# Patient Record
Sex: Female | Born: 1940 | Race: Black or African American | Hispanic: No | Smoking: Never smoker
Health system: Southern US, Community
[De-identification: ages and names within clinical notes are randomized; demographics above are authoritative.]

## PROBLEM LIST (undated history)

## (undated) DIAGNOSIS — E041 Nontoxic single thyroid nodule: Secondary | ICD-10-CM

## (undated) DIAGNOSIS — M109 Gout, unspecified: Secondary | ICD-10-CM

## (undated) DIAGNOSIS — I10 Essential (primary) hypertension: Secondary | ICD-10-CM

## (undated) DIAGNOSIS — E119 Type 2 diabetes mellitus without complications: Secondary | ICD-10-CM

## (undated) DIAGNOSIS — M810 Age-related osteoporosis without current pathological fracture: Secondary | ICD-10-CM

## (undated) DIAGNOSIS — D472 Monoclonal gammopathy: Secondary | ICD-10-CM

## (undated) DIAGNOSIS — I4891 Unspecified atrial fibrillation: Secondary | ICD-10-CM

## (undated) HISTORY — DX: Monoclonal gammopathy: D47.2

## (undated) HISTORY — DX: Unspecified atrial fibrillation: I48.91

---

## 1964-02-20 HISTORY — PX: TUBAL LIGATION: SHX77

## 2004-01-19 ENCOUNTER — Emergency Department: Payer: Self-pay | Admitting: Emergency Medicine

## 2004-02-23 ENCOUNTER — Ambulatory Visit: Payer: Self-pay

## 2005-01-13 ENCOUNTER — Emergency Department: Payer: Self-pay | Admitting: Emergency Medicine

## 2005-08-08 ENCOUNTER — Ambulatory Visit: Payer: Self-pay

## 2006-12-17 ENCOUNTER — Emergency Department: Payer: Self-pay | Admitting: Internal Medicine

## 2007-02-02 ENCOUNTER — Emergency Department: Payer: Self-pay | Admitting: Emergency Medicine

## 2007-02-25 ENCOUNTER — Ambulatory Visit: Payer: Self-pay | Admitting: Unknown Physician Specialty

## 2007-08-24 ENCOUNTER — Other Ambulatory Visit: Payer: Self-pay

## 2007-08-24 ENCOUNTER — Emergency Department: Payer: Self-pay | Admitting: Emergency Medicine

## 2008-05-14 ENCOUNTER — Emergency Department: Payer: Self-pay | Admitting: Emergency Medicine

## 2008-09-07 ENCOUNTER — Ambulatory Visit: Payer: Self-pay | Admitting: Internal Medicine

## 2009-09-22 ENCOUNTER — Ambulatory Visit: Payer: Self-pay | Admitting: Internal Medicine

## 2009-10-04 ENCOUNTER — Ambulatory Visit: Payer: Self-pay | Admitting: Internal Medicine

## 2009-10-18 ENCOUNTER — Ambulatory Visit: Payer: Self-pay | Admitting: Internal Medicine

## 2011-10-25 ENCOUNTER — Ambulatory Visit: Payer: Self-pay | Admitting: Internal Medicine

## 2011-11-20 ENCOUNTER — Ambulatory Visit: Payer: Self-pay | Admitting: Internal Medicine

## 2011-11-20 LAB — FERRITIN: Ferritin (ARMC): 72 ng/mL (ref 8–388)

## 2011-11-20 LAB — LACTATE DEHYDROGENASE: LDH: 235 U/L (ref 81–246)

## 2011-11-20 LAB — CBC CANCER CENTER
Basophil #: 0.1 x10 3/mm (ref 0.0–0.1)
Eosinophil #: 0.1 x10 3/mm (ref 0.0–0.7)
Eosinophil: 2 %
HCT: 40.1 % (ref 35.0–47.0)
Lymphocytes: 19 %
MCH: 33.4 pg (ref 26.0–34.0)
MCHC: 33.4 g/dL (ref 32.0–36.0)
Monocyte #: 0.5 x10 3/mm (ref 0.2–0.9)
Monocytes: 6 %
Neutrophil %: 63.5 %
Platelet: 220 x10 3/mm (ref 150–440)
RBC: 4.01 10*6/uL (ref 3.80–5.20)
RDW: 14.5 % (ref 11.5–14.5)
Segmented Neutrophils: 69 %

## 2011-11-20 LAB — IRON AND TIBC
Iron Bind.Cap.(Total): 320 ug/dL (ref 250–450)
Iron: 123 ug/dL (ref 50–170)
Unbound Iron-Bind.Cap.: 197 ug/dL

## 2011-11-20 LAB — RETICULOCYTES: Absolute Retic Count: 0.0685 10*6/uL (ref 0.023–0.096)

## 2011-11-20 LAB — FOLATE: Folic Acid: 16.2 ng/mL (ref 3.1–100.0)

## 2011-11-20 LAB — PROTIME-INR: INR: 1.1

## 2011-12-21 ENCOUNTER — Ambulatory Visit: Payer: Self-pay | Admitting: Internal Medicine

## 2012-08-25 ENCOUNTER — Ambulatory Visit: Payer: Self-pay | Admitting: Internal Medicine

## 2012-08-26 LAB — RETICULOCYTES
Absolute Retic Count: 0.0633 10*6/uL
Reticulocyte: 1.57 %

## 2012-08-26 LAB — CBC CANCER CENTER
Basophil: 1 %
Comment - H1-Com1: NORMAL
Comment - H1-Com2: NORMAL
HCT: 40.1 % (ref 35.0–47.0)
HGB: 13.7 g/dL (ref 12.0–16.0)
Lymphocytes: 25 %
MCH: 33.9 pg (ref 26.0–34.0)
MCHC: 34.1 g/dL (ref 32.0–36.0)
MCV: 99 fL (ref 80–100)
Monocytes: 7 %
Platelet: 230 x10 3/mm (ref 150–440)
RBC: 4.04 10*6/uL (ref 3.80–5.20)
RDW: 13.4 % (ref 11.5–14.5)
Segmented Neutrophils: 67 %
WBC: 7.9 x10 3/mm (ref 3.6–11.0)

## 2012-08-26 LAB — IRON AND TIBC
Iron Bind.Cap.(Total): 307 ug/dL (ref 250–450)
Iron Saturation: 17 %
Iron: 53 ug/dL (ref 50–170)
Unbound Iron-Bind.Cap.: 254 ug/dL

## 2012-08-26 LAB — PROTIME-INR
INR: 1.1
Prothrombin Time: 14.2 secs (ref 11.5–14.7)

## 2012-08-27 LAB — PROT IMMUNOELECTROPHORES(ARMC)

## 2012-08-28 LAB — URINE IEP, RANDOM

## 2012-09-19 ENCOUNTER — Ambulatory Visit: Payer: Self-pay | Admitting: Internal Medicine

## 2012-09-30 LAB — CBC CANCER CENTER
Basophil #: 0 x10 3/mm (ref 0.0–0.1)
Eosinophil %: 2.2 %
HCT: 39.9 % (ref 35.0–47.0)
Lymphocyte #: 2.3 x10 3/mm (ref 1.0–3.6)
Lymphocyte %: 37.8 %
MCH: 33.7 pg (ref 26.0–34.0)
MCHC: 33.7 g/dL (ref 32.0–36.0)
MCV: 100 fL (ref 80–100)
Platelet: 199 x10 3/mm (ref 150–440)
RBC: 3.99 10*6/uL (ref 3.80–5.20)
RDW: 13.3 % (ref 11.5–14.5)
WBC: 6.2 x10 3/mm (ref 3.6–11.0)

## 2012-09-30 LAB — RETICULOCYTES: Reticulocyte: 1.22 %

## 2012-10-20 ENCOUNTER — Ambulatory Visit: Payer: Self-pay | Admitting: Internal Medicine

## 2012-12-24 ENCOUNTER — Ambulatory Visit: Payer: Self-pay | Admitting: Internal Medicine

## 2013-07-15 IMAGING — MG MM CAD SCREENING MAMMO
1 series · 4 of 4 positions shown · non-contrast
Comparison: none

REASON FOR EXAM: SCR MAMMO NO ORDER
COMMENTS:

[R CC · right · 4 of 4 slices shown]
[im 1/4]
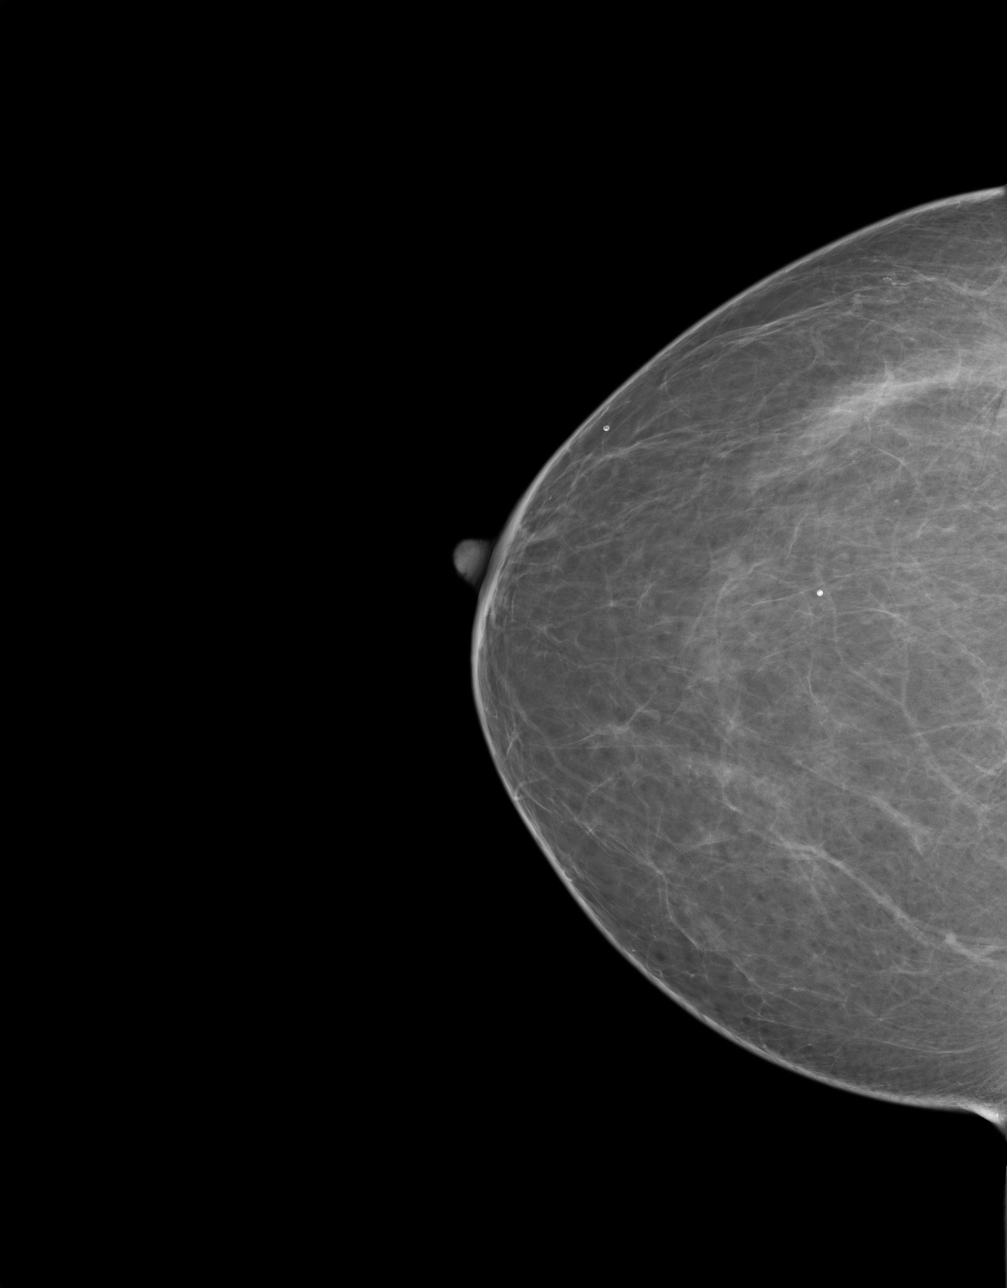
[im 2/4]
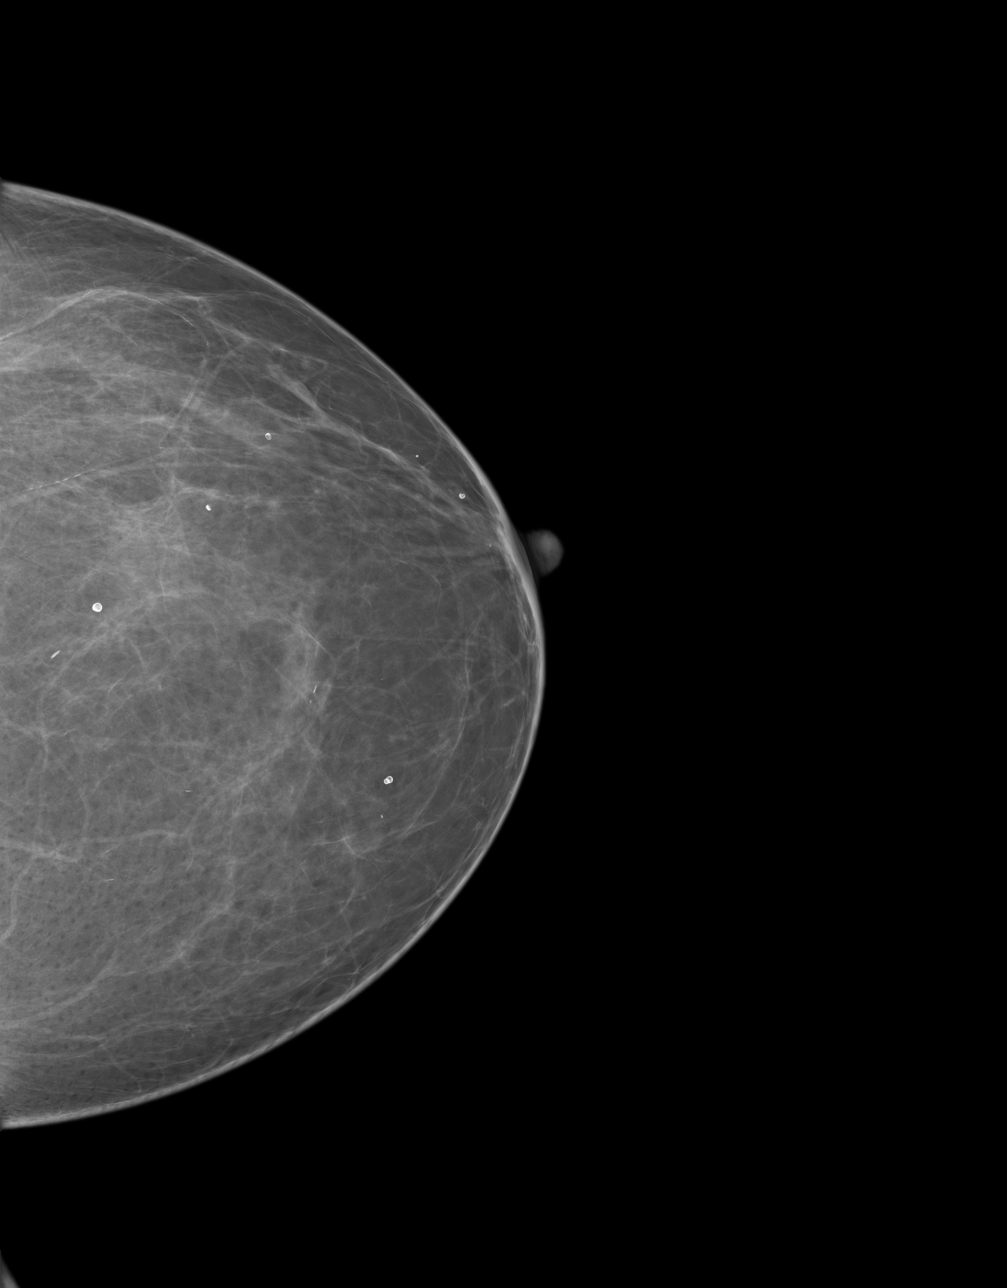
[im 3/4]
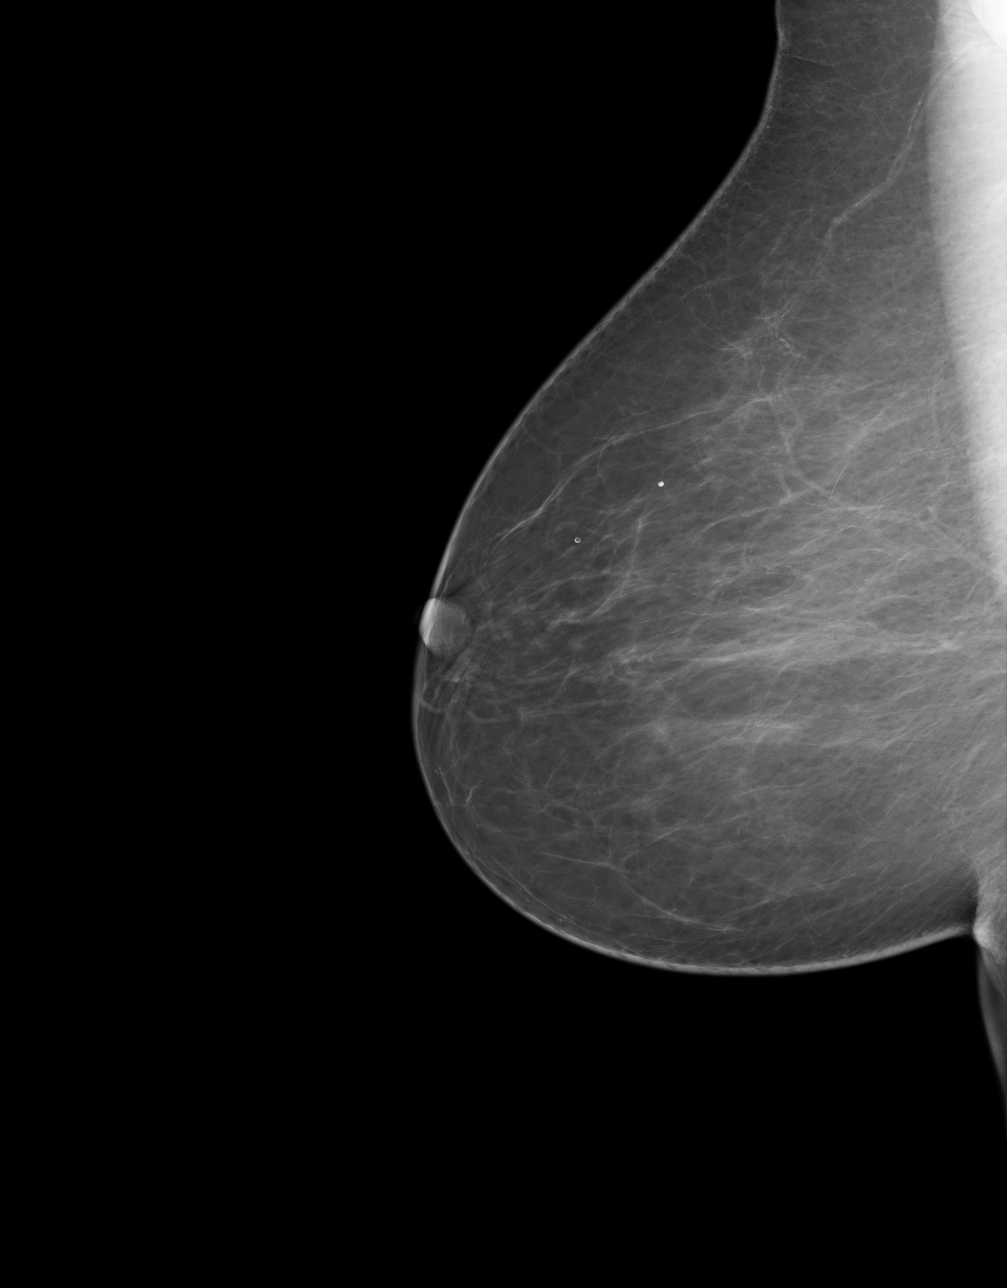
[im 4/4]
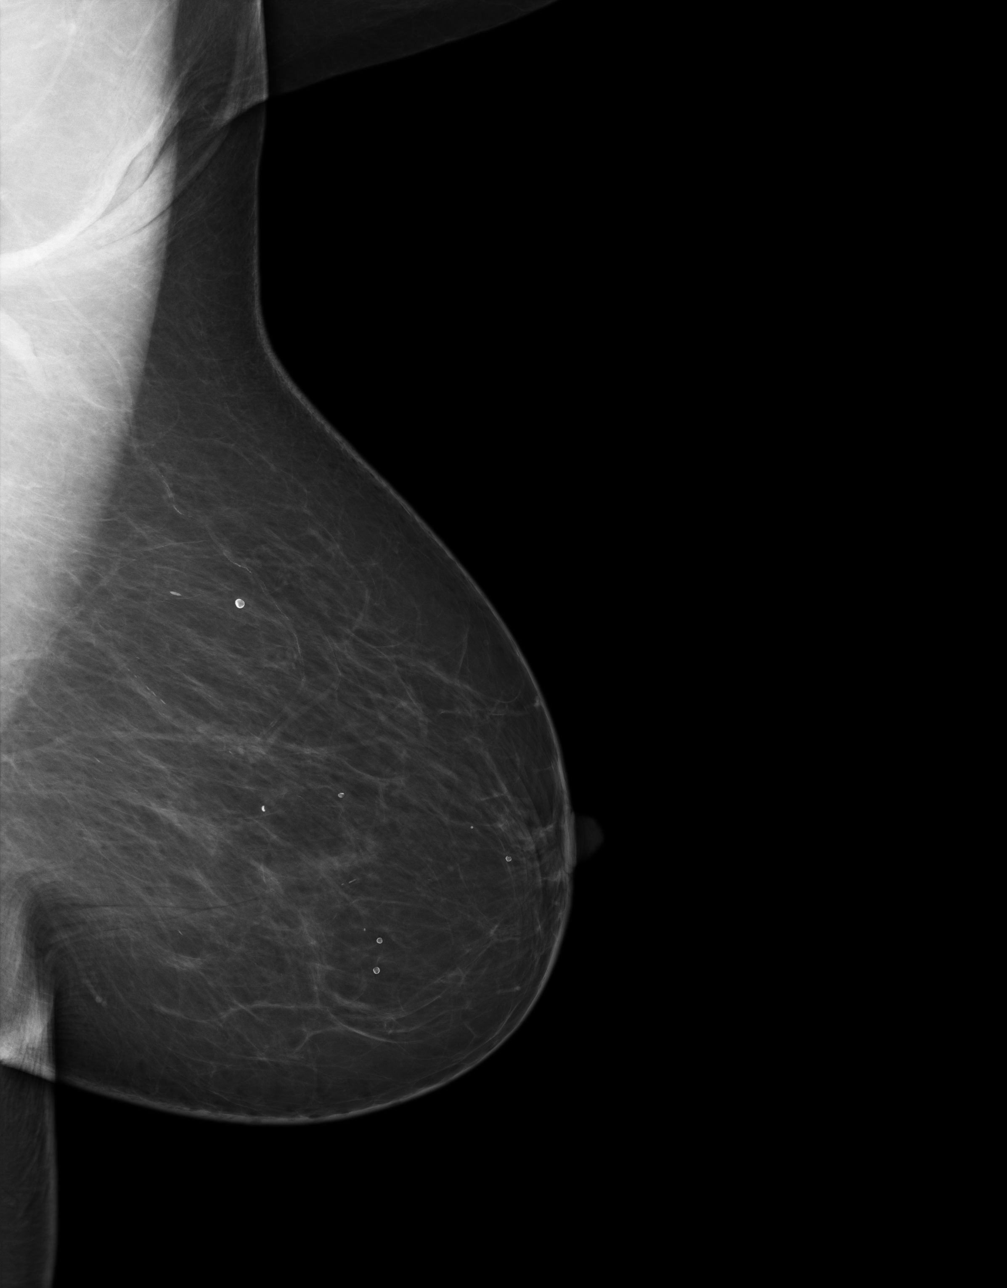

[4 of 4 positions shown; findings below may reference images not displayed]

PROCEDURE:     MAM - MAM DGTL SCRN MAM NO ORDER W/CAD  - October 25, 2011 [DATE]

RESULT:     Comparison is made to prior studies dated 10-18-2009 and
09-07-2008.

The breasts demonstrate a mild heterogeneous parenchymal pattern. Scattered,
benign appearing calcifications are identified within the right and left
breast. There is no radiographic evidence to suggest malignancy.
IMPRESSION: BI-RADS: Category 2 - Benign Findings

A NEGATIVE MAMMOGRAM REPORT DOES NOT PRECLUDE BIOPSY OR OTHER EVALUATION OF
A CLINICALLY PALPABLE OR OTHERWISE SUSPICIOUS MASS OR LESION. BREAST CANCER
MAY NOT BE DETECTED BY MAMMOGRAPHY IN UP TO 10% OF CASES.

## 2013-10-23 ENCOUNTER — Ambulatory Visit: Payer: Self-pay | Admitting: Internal Medicine

## 2013-10-23 LAB — CBC CANCER CENTER
Basophil #: 0 x10 3/mm (ref 0.0–0.1)
Basophil %: 0.7 %
EOS ABS: 0.1 x10 3/mm (ref 0.0–0.7)
EOS PCT: 1.8 %
HCT: 41.9 % (ref 35.0–47.0)
HGB: 13.8 g/dL (ref 12.0–16.0)
LYMPHS ABS: 2.2 x10 3/mm (ref 1.0–3.6)
Lymphocyte %: 42.4 %
MCH: 33.3 pg (ref 26.0–34.0)
MCHC: 32.9 g/dL (ref 32.0–36.0)
MCV: 101 fL — ABNORMAL HIGH (ref 80–100)
MONO ABS: 0.4 x10 3/mm (ref 0.2–0.9)
MONOS PCT: 7.5 %
Neutrophil #: 2.4 x10 3/mm (ref 1.4–6.5)
Neutrophil %: 47.6 %
PLATELETS: 213 x10 3/mm (ref 150–440)
RBC: 4.14 10*6/uL (ref 3.80–5.20)
RDW: 13.8 % (ref 11.5–14.5)
WBC: 5.1 x10 3/mm (ref 3.6–11.0)

## 2013-10-23 LAB — CALCIUM: CALCIUM: 9.6 mg/dL (ref 8.5–10.1)

## 2013-10-23 LAB — CREATININE, SERUM
Creatinine: 1.02 mg/dL (ref 0.60–1.30)
EGFR (African American): >60
EGFR (Non-African Amer.): 55 — ABNORMAL LOW

## 2013-10-27 LAB — PROT IMMUNOELECTROPHORES(ARMC)

## 2013-11-19 ENCOUNTER — Ambulatory Visit: Payer: Self-pay | Admitting: Internal Medicine

## 2014-02-25 ENCOUNTER — Ambulatory Visit: Payer: Self-pay | Admitting: Internal Medicine

## 2014-10-22 ENCOUNTER — Other Ambulatory Visit: Payer: Self-pay | Admitting: Family Medicine

## 2014-10-22 DIAGNOSIS — R221 Localized swelling, mass and lump, neck: Secondary | ICD-10-CM

## 2014-10-29 ENCOUNTER — Inpatient Hospital Stay: Payer: Medicare Other | Attending: Internal Medicine

## 2014-10-29 ENCOUNTER — Ambulatory Visit
Admission: RE | Admit: 2014-10-29 | Discharge: 2014-10-29 | Disposition: A | Discharge status: Home / Self Care | Payer: Medicare Other | Source: Ambulatory Visit | Attending: Family Medicine | Admitting: Family Medicine

## 2014-10-29 ENCOUNTER — Inpatient Hospital Stay (HOSPITAL_BASED_OUTPATIENT_CLINIC_OR_DEPARTMENT_OTHER): Payer: Medicare Other | Admitting: Internal Medicine

## 2014-10-29 VITALS — BP 143/85 | HR 102 | Temp 96.9°F | Resp 18 | Ht 61.5 in | Wt 167.3 lb

## 2014-10-29 DIAGNOSIS — Z79899 Other long term (current) drug therapy: Secondary | ICD-10-CM

## 2014-10-29 DIAGNOSIS — R221 Localized swelling, mass and lump, neck: Secondary | ICD-10-CM

## 2014-10-29 DIAGNOSIS — E669 Obesity, unspecified: Secondary | ICD-10-CM | POA: Insufficient documentation

## 2014-10-29 DIAGNOSIS — I10 Essential (primary) hypertension: Secondary | ICD-10-CM | POA: Insufficient documentation

## 2014-10-29 DIAGNOSIS — D696 Thrombocytopenia, unspecified: Secondary | ICD-10-CM

## 2014-10-29 DIAGNOSIS — K219 Gastro-esophageal reflux disease without esophagitis: Secondary | ICD-10-CM | POA: Insufficient documentation

## 2014-10-29 DIAGNOSIS — M109 Gout, unspecified: Secondary | ICD-10-CM | POA: Diagnosis not present

## 2014-10-29 DIAGNOSIS — E119 Type 2 diabetes mellitus without complications: Secondary | ICD-10-CM | POA: Diagnosis not present

## 2014-10-29 DIAGNOSIS — F329 Major depressive disorder, single episode, unspecified: Secondary | ICD-10-CM | POA: Diagnosis not present

## 2014-10-29 DIAGNOSIS — E785 Hyperlipidemia, unspecified: Secondary | ICD-10-CM | POA: Insufficient documentation

## 2014-10-29 DIAGNOSIS — E041 Nontoxic single thyroid nodule: Secondary | ICD-10-CM | POA: Insufficient documentation

## 2014-10-29 DIAGNOSIS — Z7982 Long term (current) use of aspirin: Secondary | ICD-10-CM | POA: Insufficient documentation

## 2014-10-29 DIAGNOSIS — D472 Monoclonal gammopathy: Secondary | ICD-10-CM

## 2014-10-29 LAB — CBC WITH DIFFERENTIAL/PLATELET
BASOS PCT: 1 %
Basophils Absolute: 0 10*3/uL (ref 0–0.1)
Eosinophils Absolute: 0.1 10*3/uL (ref 0–0.7)
Eosinophils Relative: 2 %
HCT: 40.2 % (ref 35.0–47.0)
HEMOGLOBIN: 13.3 g/dL (ref 12.0–16.0)
LYMPHS PCT: 39 %
Lymphs Abs: 1.9 10*3/uL (ref 1.0–3.6)
MCH: 32.8 pg (ref 26.0–34.0)
MCHC: 33.1 g/dL (ref 32.0–36.0)
MCV: 99.2 fL (ref 80.0–100.0)
MONO ABS: 0.4 10*3/uL (ref 0.2–0.9)
Monocytes Relative: 8 %
NEUTROS ABS: 2.6 10*3/uL (ref 1.4–6.5)
NEUTROS PCT: 50 %
Platelets: 255 10*3/uL (ref 150–440)
RBC: 4.05 MIL/uL (ref 3.80–5.20)
RDW: 13.5 % (ref 11.5–14.5)
WBC: 5.1 10*3/uL (ref 3.6–11.0)

## 2014-10-29 LAB — CALCIUM: Calcium: 9.5 mg/dL (ref 8.9–10.3)

## 2014-10-29 LAB — CREATININE, SERUM
Creatinine, Ser: 0.91 mg/dL (ref 0.44–1.00)
GFR calc Af Amer: >60 mL/min (ref >60)
GFR calc non Af Amer: >60 mL/min (ref >60)

## 2014-10-29 NOTE — Progress Notes (Signed)
Patient is here for follow-up. Patient states that she had a mass pop up in her neck a week ago from Sunday. Patient states that it was painful and  she was having trouble swallowing. She is scheduled for ultrasound of thyroid this afternoon.

## 2014-11-01 LAB — PROTEIN ELECTROPHORESIS, SERUM
A/G Ratio: 1 (ref 0.7–1.7)
ALPHA-2-GLOBULIN: 0.8 g/dL (ref 0.4–1.0)
Albumin ELP: 3.8 g/dL (ref 2.9–4.4)
Alpha-1-Globulin: 0.3 g/dL (ref 0.0–0.4)
BETA GLOBULIN: 1.7 g/dL — AB (ref 0.7–1.3)
GLOBULIN, TOTAL: 3.9 g/dL (ref 2.2–3.9)
Gamma Globulin: 1.1 g/dL (ref 0.4–1.8)
M-SPIKE, %: 0.4 g/dL — AB
Total Protein ELP: 7.7 g/dL (ref 6.0–8.5)

## 2014-11-14 NOTE — Progress Notes (Signed)
Tracey Love  Telephone:(336) [phone] Fax:(336) [phone]     ID: Tracey Love OB: Oct 16, 1940  MR#: [id number]  JYN#:[id number]  Patient Care Team: [place] as PCP - General (General Practice)  CHIEF COMPLAINT/DIAGNOSIS:  Thrombocytopenia  -  spontaneously improved on CBC of 11/20/11 and again on CBC of 08/26/12.  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 Workup otherwise remarkable for Monoclonal gammopathy of unknown significance (SIEP showed 0.4 g M protein with IgG of 1905)  Workup done 08/26/12 - Platelets 199K, hemoglobin 13.4, WBC 6200 with normal differential, reticulocyte 0.048. Random-UIEP shows negative M-spike but immunofixation positive for IgG monoclonal protein with lambda light chain specificity. SIEP shows M-spike of 0.5 g, serum IgG 1831. Otherwise PTT, INR, B12, folate were unremarkable. Serum iron 53, iron saturation slightly low at 17%.    Workup done 11/20/11 -  hemoglobin 13.4, WBC 7800, unremarkable differential, platelets 220, reticulocyte 0.0685. SIEP showed 0.4 g M-protein with IgG of 1905.   Positive direct platelet antibody test (Ia/IIa and IIb/IIIa positive) Iron study, B12, folate, LDH, haptoglobin, random-UIEP, ANA, peripheral blood flow cytometry, PT, PTT, FDP, fibrinogen, HBsAg, HCV Ab, HIV Ab all unremarkable Ultrasound abdomen negative for hepatosplenomegaly.    HISTORY OF PRESENT ILLNESS:  Patient returns for continued hematology followup, she was seen one year ago. Overall states that she is doing fairly steady, she has chronic fatigue on exertion which is the same. Denies any bleeding symptoms. No new bone pains. No new paresthesias in extremities. No fevers. Eating well. Platelet count most recently was normal range.   REVIEW OF SYSTEMS:   ROS As in HPI above. In addition, no fever, chills. No new headaches or focal weakness.  No new sore throat, cough,  sputum, hemoptysis or chest pain. No dizziness or palpitation. No abdominal pain, constipation, diarrhea, dysuria or hematuria. No new skin rash or bleeding symptoms. No new paresthesias in extremities. No polyuria polydipsia.  PAST MEDICAL HISTORY: Reviewed.         Hypertension  Hyperlipidemia  Diabetes mellitus type   ? history of arrhythmia  Chronic thrombocytopenia since 2009  Chronic fatigue  Urinary incontinence  Hypercalcemia  Gout  GERD  Depression  Osteoarthritis  Obesity  PAST SURGICAL HISTORY: Reviewed. As above.  FAMILY HISTORY: Reviewed. Denies malignancy or hematological disorders, except for daughter had blood clot.  SOCIAL HISTORY: Reviewed. Denies smoking or alcohol usage.   Allergies not on file  Current Outpatient Prescriptions  Medication Sig Dispense Refill  . acetaminophen (TYLENOL) 500 MG tablet Take 500 mg by mouth every 6 (six) hours as needed for mild pain or moderate pain.    Marland Kitchen amLODipine (NORVASC) 5 MG tablet Take 5 mg by mouth daily.    Marland Kitchen aspirin EC 81 MG tablet Take 81 mg by mouth daily.    . Cholecalciferol (VITAMIN D-3) 1000 UNITS CAPS Take 1,000 Units by mouth.    . colchicine 0.6 MG tablet Take 0.6 mg by mouth daily.    . hydrochlorothiazide (HYDRODIURIL) 25 MG tablet Take 25 mg by mouth daily.    . metFORMIN (GLUCOPHAGE) 500 MG tablet Take 500 mg by mouth 2 (two) times daily with a meal.    . omeprazole (PRILOSEC) 40 MG capsule Take 40 mg by mouth daily.    Marland Kitchen oxybutynin (DITROPAN) 5 MG tablet Take 5 mg by mouth 3 (three) times daily.    . pravastatin (PRAVACHOL) 40 MG tablet Take 40 mg by mouth  daily.    . quinapril (ACCUPRIL) 10 MG tablet Take 10 mg by mouth daily.    . vitamin E 100 UNIT capsule Take 100 Units by mouth daily.     No current facility-administered medications for this visit.    PHYSICAL EXAM: Filed Vitals:   10/29/14 1043  BP: 143/85  Pulse: 102  Temp: 96.9 F (36.1 C)  Resp: 18     Body mass index is 31.11  kg/(m^2).      GENERAL: Patient is alert and oriented and in no acute distress. No icterus. HEENT: EOMs intact. Oral exam negative for thrush or petechiae. No cervical lymphadenopathy. CVS: S1S2, regular LUNGS: Bilaterally clear to auscultation, no rhonchi. ABDOMEN: Soft, nontender. No hepatosplenomegaly clinically.  EXTREMITIES: No pedal edema.   LAB RESULTS:    Component Value Date/Time   CREATININE 0.91 10/29/2014 1031   CREATININE 1.02 10/23/2013 0928   CALCIUM 9.5 10/29/2014 1031   CALCIUM 9.6 10/23/2013 0928   GFRNONAA >60 10/29/2014 1031   GFRNONAA 55* 10/23/2013 0928   GFRAA >60 10/29/2014 1031   GFRAA >60 10/23/2013 0928    Lab Results  Component Value Date   WBC 5.1 10/29/2014   NEUTROABS 2.6 10/29/2014   HGB 13.3 10/29/2014   HCT 40.2 10/29/2014   MCV 99.2 10/29/2014   PLT 255 10/29/2014    STUDIES: 10/29/14 - SIEP with M-spike of 0.4 g/dL (was 0.4 in Sept 2015).  10/23/13 - Serum IgG was 1684.    ASSESSMENT / PLAN:   1. Thrombocytopenia, mild iron deficiency -  Reviewed labs from today and d/w patient. She is doing steady clinically with no bleeding symptoms. Platelet continues to remain in the normal range. No anemia but previous workup showed mild iron deficiency with iron saturation of 17%. Patient is on ferrous sulfate OTC 1 tablet daily. The patient has had positive direct platelet antibody test in the past and possibly has mild ITP which makes platelet count fluctuate. Given that it is normal at this time, she does not need any treatment and plan is continued monitoring. Will get CBC at 24 weeks and see her back at 48 weeks with repeat blood counts for continued monitoring of thrombocytopenia and also monoclonal gammopathy.  2.  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 No anemia or renal insufficiency. No new bone pains. No hypercalcemia. Plan is continued  monitoring, will monitor MGUS indices q24 weeks. 3. In between visits, she was advised to call in case of any fevers, night sweats, lymph node masses felt on self exam, or new symptoms and will be evaluated sooner. She is agreeable to this plan.     Leia Alf, MD   11/14/2014 10:54 PM

## 2015-04-15 ENCOUNTER — Other Ambulatory Visit: Payer: Medicare Other

## 2015-09-30 ENCOUNTER — Other Ambulatory Visit: Payer: Medicare Other

## 2015-09-30 ENCOUNTER — Ambulatory Visit: Payer: Medicare Other

## 2015-10-12 ENCOUNTER — Other Ambulatory Visit: Payer: Self-pay | Admitting: *Deleted

## 2015-10-12 DIAGNOSIS — D696 Thrombocytopenia, unspecified: Secondary | ICD-10-CM

## 2015-10-12 DIAGNOSIS — D472 Monoclonal gammopathy: Secondary | ICD-10-CM

## 2015-10-12 NOTE — Progress Notes (Signed)
[place]  Telephone:(336) [phone] Fax:(336) [phone]  ID: ANURADHA CHABOT OB: 23-Jan-1941  MR#: [id number]  LJQ#:[id number]  Patient Care Team: [place] as PCP - General (General Practice)  CHIEF COMPLAINT: MGUS  INTERVAL HISTORY: Patient last evaluated in clinic in September 2016 by Dr. Ma Hillock. She returns to clinic today for repeat laboratory work and further evaluation. She continues to feel well and remains asymptomatic. She denies any bony pain. She has no neurologic complaints. She denies any recent fevers or illnesses. She has a good appetite and denies weight loss. She has no chest pain or shortness of breath. She denies any nausea, vomiting, constipation, or diarrhea. She has no urinary complaints. Patient feels at her baseline and offers no specific complaints today.   REVIEW OF SYSTEMS:   Review of Systems  Constitutional: Negative.  Negative for fever, malaise/fatigue and weight loss.  Respiratory: Negative.  Negative for cough and shortness of breath.   Cardiovascular: Negative.  Negative for chest pain.  Gastrointestinal: Negative.  Negative for abdominal pain.  Genitourinary: Negative.   Musculoskeletal: Negative.   Neurological: Negative.  Negative for weakness.  Psychiatric/Behavioral: Negative.  The patient is not nervous/anxious.     As per HPI. Otherwise, a complete review of systems is negatve.  PAST MEDICAL HISTORY: Hypertension, diabetes. No past medical history on file.  PAST SURGICAL HISTORY: No past surgical history on file.  FAMILY HISTORY: Reviewed and unchanged. No reported history of malignancy or chronic disease.     ADVANCED DIRECTIVES (Y/N):  N   HEALTH MAINTENANCE: Social History  Substance Use Topics  . Smoking status: Not on file  . Smokeless tobacco: Not on file  . Alcohol use Not on file     Colonoscopy:  PAP:  Bone density:  Lipid panel:  Allergies not on file  Current  Outpatient Prescriptions  Medication Sig Dispense Refill  . acetaminophen (TYLENOL) 500 MG tablet Take 1,000 mg by mouth every 8 (eight) hours as needed for mild pain or moderate pain.     Marland Kitchen amLODipine (NORVASC) 5 MG tablet Take 5 mg by mouth daily.    Marland Kitchen aspirin EC 81 MG tablet Take 81 mg by mouth daily.    Marland Kitchen atorvastatin (LIPITOR) 40 MG tablet Take 40 mg by mouth daily.    . Cholecalciferol (VITAMIN D-3) 1000 UNITS CAPS Take 1,000 Units by mouth.    . colchicine 0.6 MG tablet Take 0.6 mg by mouth daily. Take 2 at first sign of gout flare, follow in 1 hour with 1 tab (max: 3 tabs within 1 hour)    . hydrochlorothiazide (HYDRODIURIL) 25 MG tablet Take 25 mg by mouth daily.    . metFORMIN (GLUCOPHAGE) 500 MG tablet Take 500 mg by mouth 2 (two) times daily with a meal.    . naproxen (NAPROSYN) 250 MG tablet Take by mouth 2 (two) times daily as needed.    Marland Kitchen omeprazole (PRILOSEC) 40 MG capsule Take 40 mg by mouth daily.    Marland Kitchen oxybutynin (DITROPAN) 5 MG tablet Take 5 mg by mouth 3 (three) times daily.    . quinapril (ACCUPRIL) 10 MG tablet Take 10 mg by mouth daily.    . vitamin E 100 UNIT capsule Take 100 Units by mouth daily.     No current facility-administered medications for this visit.     OBJECTIVE: Vitals:   10/13/15 1506  BP: 137/84  Pulse: 87  Resp: 18  Temp: 98 F (36.7 C)  Body mass index is 29.59 kg/m.    ECOG FS:0 - Asymptomatic  General: Well-developed, well-nourished, no acute distress. Eyes: Pink conjunctiva, anicteric sclera. HEENT: Normocephalic, moist mucous membranes, clear oropharnyx. Lungs: Clear to auscultation bilaterally. Heart: Regular rate and rhythm. No rubs, murmurs, or gallops. Abdomen: Soft, nontender, nondistended. No organomegaly noted, normoactive bowel sounds. Musculoskeletal: No edema, cyanosis, or clubbing. Neuro: Alert, answering all questions appropriately. Cranial nerves grossly intact. Skin: No rashes or petechiae noted. Psych: Normal  affect. Lymphatics: No cervical, calvicular, axillary or inguinal LAD.   LAB RESULTS:  Lab Results  Component Value Date   CREATININE 0.91 10/13/2015   CALCIUM 9.6 10/13/2015   GFRNONAA >60 10/13/2015   GFRAA >60 10/13/2015    Lab Results  Component Value Date   WBC 7.1 10/13/2015   NEUTROABS 3.6 10/13/2015   HGB 12.9 10/13/2015   HCT 38.1 10/13/2015   MCV 99.3 10/13/2015   PLT 228 10/13/2015   Lab Results  Component Value Date   TOTALPROTELP 7.8 10/13/2015   ALBUMINELP 3.9 10/13/2015   A1GS 0.3 10/13/2015   A2GS 0.7 10/13/2015   BETS 1.1 10/13/2015   GAMS 1.8 10/13/2015   MSPIKE 0.5 (H) 10/13/2015   SPEI Comment 10/13/2015     STUDIES: No results found.  ASSESSMENT: MGUS  PLAN:    1. MGUS:Previously noted to have IgG predominance. Patient's M spike is 0.5, which is essentially unchanged from one year prior one was 0.4. She has no evidence of endorgan damage. No intervention is needed at this time. Patient does not require metastatic survey or a bone marrow biopsy. Return to clinic in 6 months for repeat laboratory work and further evaluation. 2. Thrombocytopenia: Resolved.  Patient expressed understanding and was in agreement with this plan. She also understands that She can call clinic at any time with any questions, concerns, or complaints.    [name], MD   10/16/2015 10:31 PM

## 2015-10-13 ENCOUNTER — Inpatient Hospital Stay (HOSPITAL_BASED_OUTPATIENT_CLINIC_OR_DEPARTMENT_OTHER): Payer: Medicare Other | Admitting: Oncology

## 2015-10-13 ENCOUNTER — Inpatient Hospital Stay: Payer: Medicare Other | Attending: Oncology

## 2015-10-13 DIAGNOSIS — Z79899 Other long term (current) drug therapy: Secondary | ICD-10-CM | POA: Insufficient documentation

## 2015-10-13 DIAGNOSIS — I10 Essential (primary) hypertension: Secondary | ICD-10-CM | POA: Diagnosis not present

## 2015-10-13 DIAGNOSIS — D696 Thrombocytopenia, unspecified: Secondary | ICD-10-CM

## 2015-10-13 DIAGNOSIS — E119 Type 2 diabetes mellitus without complications: Secondary | ICD-10-CM | POA: Diagnosis not present

## 2015-10-13 DIAGNOSIS — D472 Monoclonal gammopathy: Secondary | ICD-10-CM | POA: Diagnosis not present

## 2015-10-13 LAB — CBC WITH DIFFERENTIAL/PLATELET
BASOS ABS: 0 10*3/uL (ref 0–0.1)
BASOS PCT: 0 %
Eosinophils Absolute: 0.1 10*3/uL (ref 0–0.7)
Eosinophils Relative: 2 %
HEMATOCRIT: 38.1 % (ref 35.0–47.0)
Hemoglobin: 12.9 g/dL (ref 12.0–16.0)
LYMPHS PCT: 40 %
Lymphs Abs: 2.9 10*3/uL (ref 1.0–3.6)
MCH: 33.7 pg (ref 26.0–34.0)
MCHC: 33.9 g/dL (ref 32.0–36.0)
MCV: 99.3 fL (ref 80.0–100.0)
MONO ABS: 0.6 10*3/uL (ref 0.2–0.9)
Monocytes Relative: 8 %
NEUTROS ABS: 3.6 10*3/uL (ref 1.4–6.5)
Neutrophils Relative %: 50 %
PLATELETS: 228 10*3/uL (ref 150–440)
RBC: 3.84 MIL/uL (ref 3.80–5.20)
RDW: 14.4 % (ref 11.5–14.5)
WBC: 7.1 10*3/uL (ref 3.6–11.0)

## 2015-10-13 LAB — CREATININE, SERUM: Creatinine, Ser: 0.91 mg/dL (ref 0.44–1.00)

## 2015-10-13 LAB — CALCIUM: Calcium: 9.6 mg/dL (ref 8.9–10.3)

## 2015-10-13 LAB — LACTATE DEHYDROGENASE: LDH: 172 U/L (ref 98–192)

## 2015-10-13 NOTE — Progress Notes (Signed)
States is feeling well. Offers no complaints. 

## 2015-10-14 LAB — PROTEIN ELECTROPHORESIS, SERUM
A/G RATIO SPE: 1 (ref 0.7–1.7)
ALBUMIN ELP: 3.9 g/dL (ref 2.9–4.4)
ALPHA-2-GLOBULIN: 0.7 g/dL (ref 0.4–1.0)
Alpha-1-Globulin: 0.3 g/dL (ref 0.0–0.4)
BETA GLOBULIN: 1.1 g/dL (ref 0.7–1.3)
Gamma Globulin: 1.8 g/dL (ref 0.4–1.8)
Globulin, Total: 3.9 g/dL (ref 2.2–3.9)
M-Spike, %: 0.5 g/dL — ABNORMAL HIGH
Total Protein ELP: 7.8 g/dL (ref 6.0–8.5)

## 2015-10-14 LAB — KAPPA/LAMBDA LIGHT CHAINS
Kappa free light chain: 21.5 mg/L — ABNORMAL HIGH (ref 3.3–19.4)
Kappa, lambda light chain ratio: 0.53 (ref 0.26–1.65)
LAMDA FREE LIGHT CHAINS: 40.8 mg/L — AB (ref 5.7–26.3)

## 2015-11-04 ENCOUNTER — Other Ambulatory Visit: Payer: Self-pay | Admitting: Internal Medicine

## 2015-11-04 DIAGNOSIS — E041 Nontoxic single thyroid nodule: Secondary | ICD-10-CM

## 2015-11-09 ENCOUNTER — Ambulatory Visit: Payer: Medicare Other

## 2015-11-23 ENCOUNTER — Ambulatory Visit: Payer: Medicare Other

## 2015-12-28 ENCOUNTER — Ambulatory Visit: Payer: Medicare Other

## 2016-04-16 ENCOUNTER — Inpatient Hospital Stay: Payer: Medicare Other | Attending: Oncology

## 2016-04-16 DIAGNOSIS — Z79899 Other long term (current) drug therapy: Secondary | ICD-10-CM | POA: Insufficient documentation

## 2016-04-16 DIAGNOSIS — E119 Type 2 diabetes mellitus without complications: Secondary | ICD-10-CM | POA: Insufficient documentation

## 2016-04-16 DIAGNOSIS — I10 Essential (primary) hypertension: Secondary | ICD-10-CM | POA: Insufficient documentation

## 2016-04-16 DIAGNOSIS — D472 Monoclonal gammopathy: Secondary | ICD-10-CM | POA: Diagnosis not present

## 2016-04-16 LAB — BASIC METABOLIC PANEL
ANION GAP: 9 (ref 5–15)
BUN: 15 mg/dL (ref 6–20)
CALCIUM: 10.1 mg/dL (ref 8.9–10.3)
CO2: 30 mmol/L (ref 22–32)
Chloride: 98 mmol/L — ABNORMAL LOW (ref 101–111)
Creatinine, Ser: 0.91 mg/dL (ref 0.44–1.00)
GFR calc non Af Amer: >60 mL/min (ref >60)
Glucose, Bld: 93 mg/dL (ref 65–99)
POTASSIUM: 3.5 mmol/L (ref 3.5–5.1)
Sodium: 137 mmol/L (ref 135–145)

## 2016-04-16 LAB — CBC WITH DIFFERENTIAL/PLATELET
BASOS ABS: 0.1 10*3/uL (ref 0–0.1)
BASOS PCT: 2 %
Eosinophils Absolute: 0.1 10*3/uL (ref 0–0.7)
Eosinophils Relative: 2 %
HEMATOCRIT: 40.7 % (ref 35.0–47.0)
HEMOGLOBIN: 13.9 g/dL (ref 12.0–16.0)
LYMPHS PCT: 34 %
Lymphs Abs: 2.5 10*3/uL (ref 1.0–3.6)
MCH: 33.8 pg (ref 26.0–34.0)
MCHC: 34.3 g/dL (ref 32.0–36.0)
MCV: 98.7 fL (ref 80.0–100.0)
MONO ABS: 0.6 10*3/uL (ref 0.2–0.9)
Monocytes Relative: 8 %
NEUTROS ABS: 4 10*3/uL (ref 1.4–6.5)
NEUTROS PCT: 54 %
Platelets: 207 10*3/uL (ref 150–440)
RBC: 4.12 MIL/uL (ref 3.80–5.20)
RDW: 14.3 % (ref 11.5–14.5)
WBC: 7.4 10*3/uL (ref 3.6–11.0)

## 2016-04-17 LAB — KAPPA/LAMBDA LIGHT CHAINS
KAPPA FREE LGHT CHN: 20.7 mg/L — AB (ref 3.3–19.4)
Kappa, lambda light chain ratio: 0.53 (ref 0.26–1.65)
LAMDA FREE LIGHT CHAINS: 39.3 mg/L — AB (ref 5.7–26.3)

## 2016-04-18 LAB — PROTEIN ELECTROPHORESIS, SERUM
A/G Ratio: 1.2 (ref 0.7–1.7)
ALPHA-1-GLOBULIN: 0.3 g/dL (ref 0.0–0.4)
ALPHA-2-GLOBULIN: 0.7 g/dL (ref 0.4–1.0)
Albumin ELP: 4.4 g/dL (ref 2.9–4.4)
Beta Globulin: 1 g/dL (ref 0.7–1.3)
GAMMA GLOBULIN: 1.7 g/dL (ref 0.4–1.8)
Globulin, Total: 3.7 g/dL (ref 2.2–3.9)
M-SPIKE, %: 0.4 g/dL — AB
Total Protein ELP: 8.1 g/dL (ref 6.0–8.5)

## 2016-04-20 ENCOUNTER — Telehealth: Payer: Self-pay

## 2016-04-20 NOTE — Telephone Encounter (Signed)
Tried to call patient, no answer and no voicemail will try to cal later. BP

## 2016-04-20 NOTE — Telephone Encounter (Signed)
Called patient number again, no answer. I called her Emergency contact Lucille and left her a voicemail to have her mother give [place] a cal that we had some results to give patient.

## 2016-04-20 NOTE — Telephone Encounter (Signed)
-----   Message from Timothy J Finnegan, MD sent at 04/20/2016  9:10 AM EST ----- Regarding: RE: Lab results Labs are unchanged.  She can come back in 6 months with lab only and see me 1 week later.  She will have to figure out out to make this appointment though.   ----- Message ----- From: Jamaiyah Pyle N Shelby Peltz, CMA Sent: 04/20/2016   9:04 AM To: Timothy J Finnegan, MD Subject: FW: Lab results                                 She would like to have her lab results.How do you want to schedule patient since she does not have a ride for Monday? ----- Message ----- From: Angela B Fuller Sent: 04/17/2016   2:27 PM To: Hayley Rhode, RN, Dollie Bressi N Estreya Clay, CMA Subject: Lab results                                    Pt would like for you to call her about her lab results. She does not have a ride for Monday March 5th, but she would still like to know her results.   Thank you   

## 2016-04-23 ENCOUNTER — Inpatient Hospital Stay: Payer: Medicare Other | Admitting: Oncology

## 2016-04-23 NOTE — Telephone Encounter (Signed)
Patient's daughter called and wanted results from patient blood work. Called back explained I called several times Friday and unable to reach anyone and gave the patient results over the phone. Message sent to scheduling to make her appointment in 6 months.

## 2016-04-25 NOTE — Telephone Encounter (Signed)
Talked with patient about her results and in basket sent to scheduling to have her appointments made.

## 2016-04-25 NOTE — Telephone Encounter (Signed)
-----   Message from [name], MD sent at 04/20/2016  9:10 AM EST ----- Regarding: RE: Lab results Labs are unchanged.  She can come back in 6 months with lab only and see me 1 week later.  She will have to figure out out to make this appointment though.   ----- Message ----- From: Luretha Murphy, CMA Sent: 04/20/2016   9:04 AM To: [name], MD Subject: FW: Lab results                                 She would like to have her lab results.How do you want to schedule patient since she does not have a ride for Monday? ----- Message ----- From: Wallene Dales Sent: 04/17/2016   2:27 PM To: [place], RN, Luretha Murphy, [place] Subject: Lab results                                    Pt would like for you to call her about her lab results. She does not have a ride for Monday March 5th, but she would still like to know her results.   Thank you

## 2016-07-24 ENCOUNTER — Other Ambulatory Visit: Payer: Self-pay | Admitting: Internal Medicine

## 2016-07-24 DIAGNOSIS — N951 Menopausal and female climacteric states: Secondary | ICD-10-CM

## 2016-07-24 DIAGNOSIS — Z1231 Encounter for screening mammogram for malignant neoplasm of breast: Secondary | ICD-10-CM

## 2016-07-25 ENCOUNTER — Other Ambulatory Visit: Payer: Self-pay | Admitting: Internal Medicine

## 2016-07-25 DIAGNOSIS — E041 Nontoxic single thyroid nodule: Secondary | ICD-10-CM

## 2016-09-11 ENCOUNTER — Ambulatory Visit: Payer: Medicare Other

## 2016-09-11 ENCOUNTER — Other Ambulatory Visit: Payer: Medicare Other

## 2016-10-03 ENCOUNTER — Other Ambulatory Visit: Payer: Self-pay | Admitting: Internal Medicine

## 2016-10-03 DIAGNOSIS — R748 Abnormal levels of other serum enzymes: Secondary | ICD-10-CM

## 2016-10-09 ENCOUNTER — Ambulatory Visit: Payer: Medicare Other

## 2016-10-23 ENCOUNTER — Inpatient Hospital Stay: Payer: Medicare Other

## 2016-10-30 ENCOUNTER — Ambulatory Visit: Payer: Medicare Other | Admitting: Oncology

## 2017-08-12 ENCOUNTER — Other Ambulatory Visit: Payer: Self-pay | Admitting: Internal Medicine

## 2017-08-12 DIAGNOSIS — N951 Menopausal and female climacteric states: Secondary | ICD-10-CM

## 2017-08-12 DIAGNOSIS — Z1231 Encounter for screening mammogram for malignant neoplasm of breast: Secondary | ICD-10-CM

## 2017-08-25 NOTE — Progress Notes (Signed)
[place]  Telephone:(336) [phone] Fax:(336) [phone]  ID: Tracey Love OB: [birth year]  MR#: [id number]  VXY#:[id number]  Patient Care Team: [facility] [place] as PCP - General (General Practice)  CHIEF COMPLAINT: MGUS  INTERVAL HISTORY: Patient was last seen in clinic in August 2017.  She is referred back to reestablish care and monitoring of her MGUS.  She currently feels well and is asymptomatic. She denies any bony pain. She has no neurologic complaints. She denies any recent fevers or illnesses. She has a good appetite and denies weight loss. She has no chest pain or shortness of breath. She denies any nausea, vomiting, constipation, or diarrhea. She has no urinary complaints.  Patient feels at her baseline offers no specific complaints today.  REVIEW OF SYSTEMS:   Review of Systems  Constitutional: Negative.  Negative for fever, malaise/fatigue and weight loss.  Respiratory: Negative.  Negative for cough and shortness of breath.   Cardiovascular: Negative.  Negative for chest pain and leg swelling.  Gastrointestinal: Negative.  Negative for abdominal pain and constipation.  Genitourinary: Negative.  Negative for dysuria.  Musculoskeletal: Negative.  Negative for back pain.  Skin: Negative.   Neurological: Negative.  Negative for sensory change, focal weakness and weakness.  Psychiatric/Behavioral: Negative.  The patient is not nervous/anxious.     As per HPI. Otherwise, a complete review of systems is negatve.  PAST MEDICAL HISTORY: Hypertension, diabetes.  PAST SURGICAL HISTORY: Negative or noncontributory.  FAMILY HISTORY: Reviewed and unchanged. No reported history of malignancy or chronic disease.     ADVANCED DIRECTIVES (Y/N):  N   HEALTH MAINTENANCE: Social History   Tobacco Use  . Smoking status: Not on file  Substance Use Topics  . Alcohol use: Not on file  . Drug use: Not on file     Colonoscopy:  PAP:  Bone  density:  Lipid panel:  Allergies not on file  Current Outpatient Medications  Medication Sig Dispense Refill  . acetaminophen (TYLENOL) 500 MG tablet Take 1,000 mg by mouth every 8 (eight) hours as needed for mild pain or moderate pain.     Marland Kitchen amLODipine (NORVASC) 5 MG tablet Take 5 mg by mouth daily.    Marland Kitchen aspirin EC 81 MG tablet Take 81 mg by mouth daily.    Marland Kitchen atorvastatin (LIPITOR) 40 MG tablet Take 40 mg by mouth daily.    . Cholecalciferol (VITAMIN D-3) 1000 UNITS CAPS Take 1,000 Units by mouth.    . colchicine 0.6 MG tablet Take 0.6 mg by mouth daily. Take 2 at first sign of gout flare, follow in 1 hour with 1 tab (max: 3 tabs within 1 hour)    . hydrochlorothiazide (HYDRODIURIL) 25 MG tablet Take 25 mg by mouth daily.    . metFORMIN (GLUCOPHAGE) 500 MG tablet Take 500 mg by mouth 2 (two) times daily with a meal.    . naproxen (NAPROSYN) 250 MG tablet Take by mouth 2 (two) times daily as needed.    Marland Kitchen omeprazole (PRILOSEC) 40 MG capsule Take 40 mg by mouth daily.    Marland Kitchen oxybutynin (DITROPAN) 5 MG tablet Take 5 mg by mouth 3 (three) times daily.    . quinapril (ACCUPRIL) 10 MG tablet Take 10 mg by mouth daily.    . vitamin E 100 UNIT capsule Take 100 Units by mouth daily.     No current facility-administered medications for this visit.     OBJECTIVE: There were no vitals filed for this visit.  There is no height or weight on file to calculate BMI.    ECOG FS:0 - Asymptomatic  General: Well-developed, well-nourished, no acute distress. Eyes: Pink conjunctiva, anicteric sclera. HEENT: Normocephalic, moist mucous membranes, clear oropharnyx. Lungs: Clear to auscultation bilaterally. Heart: Regular rate and rhythm. No rubs, murmurs, or gallops. Abdomen: Soft, nontender, nondistended. No organomegaly noted, normoactive bowel sounds. Musculoskeletal: No edema, cyanosis, or clubbing. Neuro: Alert, answering all questions appropriately. Cranial nerves grossly intact. Skin: No rashes or  petechiae noted. Psych: Normal affect. Lymphatics: No cervical, calvicular, axillary or inguinal LAD.   LAB RESULTS:  Lab Results  Component Value Date   NA 133 (L) 08/26/2017   K 3.6 08/26/2017   CL 97 (L) 08/26/2017   CO2 25 08/26/2017   GLUCOSE 115 (H) 08/26/2017   BUN 11 08/26/2017   CREATININE 0.92 08/26/2017   CALCIUM 10.2 08/26/2017   GFRNONAA 59 (L) 08/26/2017   GFRAA >60 08/26/2017    Lab Results  Component Value Date   WBC 5.1 08/26/2017   NEUTROABS 3.0 08/26/2017   HGB 13.7 08/26/2017   HCT 40.0 08/26/2017   MCV 101.7 (H) 08/26/2017   PLT 199 08/26/2017   Lab Results  Component Value Date   TOTALPROTELP 8.1 04/16/2016   ALBUMINELP 4.4 04/16/2016   A1GS 0.3 04/16/2016   A2GS 0.7 04/16/2016   BETS 1.0 04/16/2016   GAMS 1.7 04/16/2016   MSPIKE 0.4 (H) 04/16/2016   SPEI Comment 04/16/2016     STUDIES: No results found.  ASSESSMENT: MGUS  PLAN:    1. MGUS: Previously noted to have IgG predominance.  Patient's most recent M spike in February 2018 was reported as 0.4 which is essentially unchanged from previous..  This was repeated today along with her immunoglobulin levels as well as kappa and lambda free light chains.  These are pending at time of dictation.  She has no evidence of endorgan damage.  New intervention is needed.  She does not require bone marrow biopsy or metastatic bone survey.  Patient is at low risk for progressing to overt multiple myeloma, therefore continue with yearly laboratory work and evaluation.  All this was explained to the patient in detail.  I spent a total of 30 minutes face-to-face with the patient of which greater than 50% of the visit was spent in counseling and coordination of care as detailed above.  Patient expressed understanding and was in agreement with this plan. She also understands that She can call clinic at any time with any questions, concerns, or complaints.    [name], MD   08/26/2017 1:40  PM

## 2017-08-26 ENCOUNTER — Inpatient Hospital Stay: Payer: Medicare Other

## 2017-08-26 ENCOUNTER — Inpatient Hospital Stay: Payer: Medicare Other | Attending: Oncology | Admitting: Oncology

## 2017-08-26 DIAGNOSIS — D472 Monoclonal gammopathy: Secondary | ICD-10-CM | POA: Diagnosis present

## 2017-08-26 LAB — CBC WITH DIFFERENTIAL/PLATELET
BASOS ABS: 0 10*3/uL (ref 0–0.1)
BASOS PCT: 1 %
Eosinophils Absolute: 0.1 10*3/uL (ref 0–0.7)
Eosinophils Relative: 1 %
HEMATOCRIT: 40 % (ref 35.0–47.0)
HEMOGLOBIN: 13.7 g/dL (ref 12.0–16.0)
LYMPHS PCT: 30 %
Lymphs Abs: 1.5 10*3/uL (ref 1.0–3.6)
MCH: 34.9 pg — ABNORMAL HIGH (ref 26.0–34.0)
MCHC: 34.3 g/dL (ref 32.0–36.0)
MCV: 101.7 fL — ABNORMAL HIGH (ref 80.0–100.0)
Monocytes Absolute: 0.5 10*3/uL (ref 0.2–0.9)
Monocytes Relative: 9 %
Neutro Abs: 3 10*3/uL (ref 1.4–6.5)
Neutrophils Relative %: 59 %
Platelets: 199 10*3/uL (ref 150–440)
RBC: 3.94 MIL/uL (ref 3.80–5.20)
RDW: 13.9 % (ref 11.5–14.5)
WBC: 5.1 10*3/uL (ref 3.6–11.0)

## 2017-08-26 LAB — BASIC METABOLIC PANEL
ANION GAP: 11 (ref 5–15)
BUN: 11 mg/dL (ref 8–23)
CALCIUM: 10.2 mg/dL (ref 8.9–10.3)
CHLORIDE: 97 mmol/L — AB (ref 98–111)
CO2: 25 mmol/L (ref 22–32)
Creatinine, Ser: 0.92 mg/dL (ref 0.44–1.00)
GFR calc non Af Amer: 59 mL/min — ABNORMAL LOW (ref >60)
Glucose, Bld: 115 mg/dL — ABNORMAL HIGH (ref 70–99)
Potassium: 3.6 mmol/L (ref 3.5–5.1)
Sodium: 133 mmol/L — ABNORMAL LOW (ref 135–145)

## 2017-08-27 LAB — PROTEIN ELECTROPHORESIS, SERUM
A/G Ratio: 1 (ref 0.7–1.7)
ALPHA-1-GLOBULIN: 0.3 g/dL (ref 0.0–0.4)
ALPHA-2-GLOBULIN: 0.8 g/dL (ref 0.4–1.0)
Albumin ELP: 4 g/dL (ref 2.9–4.4)
Beta Globulin: 1 g/dL (ref 0.7–1.3)
GLOBULIN, TOTAL: 4 g/dL — AB (ref 2.2–3.9)
Gamma Globulin: 2 g/dL — ABNORMAL HIGH (ref 0.4–1.8)
M-SPIKE, %: 0.6 g/dL — AB
Total Protein ELP: 8 g/dL (ref 6.0–8.5)

## 2017-08-27 LAB — IGG, IGA, IGM
IGA: 267 mg/dL (ref 64–422)
IGM (IMMUNOGLOBULIN M), SRM: 38 mg/dL (ref 26–217)
IgG (Immunoglobin G), Serum: 1839 mg/dL — ABNORMAL HIGH (ref 700–1600)

## 2017-08-27 LAB — KAPPA/LAMBDA LIGHT CHAINS
KAPPA FREE LGHT CHN: 22.3 mg/L — AB (ref 3.3–19.4)
Kappa, lambda light chain ratio: 0.52 (ref 0.26–1.65)
Lambda free light chains: 43.2 mg/L — ABNORMAL HIGH (ref 5.7–26.3)

## 2017-09-02 ENCOUNTER — Other Ambulatory Visit: Payer: Self-pay | Admitting: Internal Medicine

## 2017-09-02 DIAGNOSIS — R748 Abnormal levels of other serum enzymes: Secondary | ICD-10-CM

## 2017-09-09 ENCOUNTER — Ambulatory Visit
Admission: RE | Admit: 2017-09-09 | Discharge: 2017-09-09 | Disposition: A | Discharge status: Home / Self Care | Payer: Medicare Other | Source: Ambulatory Visit | Attending: Internal Medicine | Admitting: Internal Medicine

## 2017-09-09 DIAGNOSIS — R748 Abnormal levels of other serum enzymes: Secondary | ICD-10-CM | POA: Diagnosis present

## 2017-09-09 DIAGNOSIS — K824 Cholesterolosis of gallbladder: Secondary | ICD-10-CM | POA: Insufficient documentation

## 2017-09-09 DIAGNOSIS — N289 Disorder of kidney and ureter, unspecified: Secondary | ICD-10-CM | POA: Diagnosis not present

## 2017-09-09 DIAGNOSIS — I700 Atherosclerosis of aorta: Secondary | ICD-10-CM | POA: Diagnosis not present

## 2017-09-11 ENCOUNTER — Ambulatory Visit
Admission: RE | Admit: 2017-09-11 | Discharge: 2017-09-11 | Disposition: A | Discharge status: Home / Self Care | Payer: Medicare Other | Source: Ambulatory Visit | Attending: Internal Medicine | Admitting: Internal Medicine

## 2017-09-11 ENCOUNTER — Encounter (HOSPITAL_COMMUNITY): Payer: Self-pay

## 2017-09-11 DIAGNOSIS — Z1231 Encounter for screening mammogram for malignant neoplasm of breast: Secondary | ICD-10-CM | POA: Diagnosis present

## 2017-09-11 DIAGNOSIS — M85832 Other specified disorders of bone density and structure, left forearm: Secondary | ICD-10-CM | POA: Diagnosis not present

## 2017-09-11 DIAGNOSIS — N951 Menopausal and female climacteric states: Secondary | ICD-10-CM | POA: Insufficient documentation

## 2018-08-04 ENCOUNTER — Telehealth: Payer: Self-pay | Admitting: *Deleted

## 2018-08-04 NOTE — Telephone Encounter (Signed)
Per patient request to cancel 08/25/18 lab and 09/01/18 MD appt. She stated that she did not want to R/S at this time, she will call back at a later date to R/S

## 2018-08-25 ENCOUNTER — Other Ambulatory Visit: Payer: Medicare Other

## 2018-09-01 ENCOUNTER — Ambulatory Visit: Payer: Medicare Other | Admitting: Oncology

## 2018-10-15 ENCOUNTER — Encounter: Payer: Self-pay | Admitting: *Deleted

## 2018-10-15 ENCOUNTER — Other Ambulatory Visit: Payer: Self-pay

## 2018-10-17 ENCOUNTER — Other Ambulatory Visit
Admission: RE | Admit: 2018-10-17 | Discharge: 2018-10-17 | Disposition: A | Discharge status: Home / Self Care | Payer: Medicare Other | Source: Ambulatory Visit | Attending: Ophthalmology | Admitting: Ophthalmology

## 2018-10-17 ENCOUNTER — Other Ambulatory Visit: Payer: Self-pay

## 2018-10-17 DIAGNOSIS — Z01812 Encounter for preprocedural laboratory examination: Secondary | ICD-10-CM | POA: Diagnosis present

## 2018-10-17 DIAGNOSIS — Z20828 Contact with and (suspected) exposure to other viral communicable diseases: Secondary | ICD-10-CM | POA: Insufficient documentation

## 2018-10-17 LAB — SARS CORONAVIRUS 2 (TAT 6-24 HRS): SARS Coronavirus 2: NEGATIVE

## 2018-10-20 NOTE — Anesthesia Preprocedure Evaluation (Addendum)
Anesthesia Evaluation  Patient identified by MRN, date of birth, ID band Patient awake    Reviewed: Allergy & Precautions, NPO status , Patient's Chart, lab work & pertinent test results  Airway Mallampati: IV  TM Distance: >3 FB Neck ROM: Limited  Mouth opening: Limited Mouth Opening  Dental   Pulmonary    breath sounds clear to auscultation       Cardiovascular hypertension, (-) angina(-) DOE  Rhythm:Regular Rate:Normal     Neuro/Psych    GI/Hepatic GERD  Medicated and Controlled,  Endo/Other  diabetes, Type 2  Renal/GU      Musculoskeletal   Abdominal (+) + obese (BMI 31),   Peds  Hematology  MGUS   Anesthesia Other Findings Gout Osteoporosis  Reproductive/Obstetrics                            Anesthesia Physical Anesthesia Plan  ASA: II  Anesthesia Plan: MAC   Post-op Pain Management:    Induction: Intravenous  PONV Risk Score and Plan: TIVA and Midazolam  Airway Management Planned: Nasal Cannula  Additional Equipment:   Intra-op Plan:   Post-operative Plan:   Informed Consent: I have reviewed the patients History and Physical, chart, labs and discussed the procedure including the risks, benefits and alternatives for the proposed anesthesia with the patient or authorized representative who has indicated his/her understanding and acceptance.       Plan Discussed with: CRNA and Anesthesiologist  Anesthesia Plan Comments:        Anesthesia Quick Evaluation   Active Ambulatory Problems    Diagnosis Date Noted  . 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 No Resolved Ambulatory Problems   Past Medical History:  Diagnosis Date  . Diabetes mellitus, type 2 ([place])   . Gout   . Hypertension   . Osteoporosis   . Thyroid nodule     CBC    Component Value Date/Time   WBC 5.1  08/26/2017 1145   RBC 3.94 08/26/2017 1145   HGB 13.7 08/26/2017 1145   HGB 13.8 10/23/2013 0928   HCT 40.0 08/26/2017 1145   HCT 41.9 10/23/2013 0928   PLT 199 08/26/2017 1145   PLT 213 10/23/2013 0928   MCV 101.7 (H) 08/26/2017 1145   MCV 101 (H) 10/23/2013 0928   MCH 34.9 (H) 08/26/2017 1145   MCHC 34.3 08/26/2017 1145   RDW 13.9 08/26/2017 1145   RDW 13.8 10/23/2013 0928   LYMPHSABS 1.5 08/26/2017 1145   LYMPHSABS 2.2 10/23/2013 0928   MONOABS 0.5 08/26/2017 1145   MONOABS 0.4 10/23/2013 0928   EOSABS 0.1 08/26/2017 1145   EOSABS 0.1 10/23/2013 0928   BASOSABS 0.0 08/26/2017 1145   BASOSABS 0.0 10/23/2013 0928    CMP     Component Value Date/Time   NA 133 (L) 08/26/2017 1145   K 3.6 08/26/2017 1145   CL 97 (L) 08/26/2017 1145   CO2 25 08/26/2017 1145   GLUCOSE 115 (H) 08/26/2017 1145   BUN 11 08/26/2017 1145   CREATININE 0.92 08/26/2017 1145   CREATININE 1.02 10/23/2013 0928   CALCIUM 10.2 08/26/2017 1145   CALCIUM 9.6 10/23/2013 0928   GFRNONAA 59 (L) 08/26/2017 1145   GFRNONAA 55 (L) 10/23/2013 0928   GFRAA >60 08/26/2017 1145   GFRAA >60 10/23/2013 0928    COAGS    Component Value Date/Time   INR 1.1  08/26/2012 1224    I have seen and consented the patient, Tracey Love. I have answered all of her questions regarding anesthesia. she is appropriately NPO.   Josephina Shih, MD Anesthesia

## 2018-10-20 NOTE — Discharge Instructions (Signed)

## 2018-10-21 ENCOUNTER — Ambulatory Visit
Admission: RE | Admit: 2018-10-21 | Discharge: 2018-10-21 | Disposition: A | Discharge status: Home / Self Care | Payer: Medicare Other | Attending: Ophthalmology | Admitting: Ophthalmology

## 2018-10-21 ENCOUNTER — Ambulatory Visit: Payer: Medicare Other | Admitting: Anesthesiology

## 2018-10-21 ENCOUNTER — Encounter
Admission: RE | Disposition: A | Discharge status: Home / Self Care | Payer: Self-pay | Source: Home / Self Care | Attending: Ophthalmology

## 2018-10-21 DIAGNOSIS — I10 Essential (primary) hypertension: Secondary | ICD-10-CM | POA: Insufficient documentation

## 2018-10-21 DIAGNOSIS — R0602 Shortness of breath: Secondary | ICD-10-CM | POA: Insufficient documentation

## 2018-10-21 DIAGNOSIS — R609 Edema, unspecified: Secondary | ICD-10-CM | POA: Diagnosis not present

## 2018-10-21 DIAGNOSIS — K219 Gastro-esophageal reflux disease without esophagitis: Secondary | ICD-10-CM | POA: Insufficient documentation

## 2018-10-21 DIAGNOSIS — M109 Gout, unspecified: Secondary | ICD-10-CM | POA: Diagnosis not present

## 2018-10-21 DIAGNOSIS — E1136 Type 2 diabetes mellitus with diabetic cataract: Secondary | ICD-10-CM | POA: Diagnosis not present

## 2018-10-21 DIAGNOSIS — H2511 Age-related nuclear cataract, right eye: Secondary | ICD-10-CM | POA: Insufficient documentation

## 2018-10-21 DIAGNOSIS — E669 Obesity, unspecified: Secondary | ICD-10-CM | POA: Insufficient documentation

## 2018-10-21 DIAGNOSIS — F329 Major depressive disorder, single episode, unspecified: Secondary | ICD-10-CM | POA: Insufficient documentation

## 2018-10-21 DIAGNOSIS — M810 Age-related osteoporosis without current pathological fracture: Secondary | ICD-10-CM | POA: Insufficient documentation

## 2018-10-21 DIAGNOSIS — R262 Difficulty in walking, not elsewhere classified: Secondary | ICD-10-CM | POA: Insufficient documentation

## 2018-10-21 DIAGNOSIS — Z6831 Body mass index (BMI) 31.0-31.9, adult: Secondary | ICD-10-CM | POA: Diagnosis not present

## 2018-10-21 DIAGNOSIS — M1990 Unspecified osteoarthritis, unspecified site: Secondary | ICD-10-CM | POA: Insufficient documentation

## 2018-10-21 HISTORY — DX: Age-related osteoporosis without current pathological fracture: M81.0

## 2018-10-21 HISTORY — PX: CATARACT EXTRACTION W/PHACO: SHX586

## 2018-10-21 HISTORY — DX: Nontoxic single thyroid nodule: E04.1

## 2018-10-21 HISTORY — DX: Gout, unspecified: M10.9

## 2018-10-21 HISTORY — DX: Type 2 diabetes mellitus without complications: E11.9

## 2018-10-21 HISTORY — DX: Essential (primary) hypertension: I10

## 2018-10-21 LAB — GLUCOSE, CAPILLARY
Glucose-Capillary: 115 mg/dL — ABNORMAL HIGH (ref 70–99)
Glucose-Capillary: 125 mg/dL — ABNORMAL HIGH (ref 70–99)

## 2018-10-21 SURGERY — PHACOEMULSIFICATION, CATARACT, WITH IOL INSERTION
Anesthesia: Monitor Anesthesia Care | Site: Eye | Laterality: Right

## 2018-10-21 MED ORDER — LIDOCAINE HCL (PF) 1 % IJ SOLN
INTRAMUSCULAR | Status: AC
  Filled 2018-10-21: qty 30

## 2018-10-21 MED ORDER — LIDOCAINE HCL (PF) 2 % IJ SOLN
INTRAOCULAR | Status: DC | PRN
  Administered 2018-10-21: 1 mL

## 2018-10-21 MED ORDER — NA CHONDROIT SULF-NA HYALURON 40-17 MG/ML IO SOLN
INTRAOCULAR | Status: DC | PRN
  Administered 2018-10-21: 1 mL via INTRAOCULAR

## 2018-10-21 MED ORDER — FENTANYL CITRATE (PF) 100 MCG/2ML IJ SOLN
INTRAMUSCULAR | Status: DC | PRN
  Administered 2018-10-21 (×2): 50 ug via INTRAVENOUS

## 2018-10-21 MED ORDER — TRYPAN BLUE 0.06 % OP SOLN
OPHTHALMIC | Status: DC | PRN
  Administered 2018-10-21: 0.5 mL via INTRAOCULAR

## 2018-10-21 MED ORDER — ACETAMINOPHEN 10 MG/ML IV SOLN: 1000.0000 mg | Freq: Once | INTRAVENOUS | Status: DC | PRN

## 2018-10-21 MED ORDER — BUPIVACAINE HCL (PF) 0.5 % IJ SOLN
INTRAMUSCULAR | Status: AC
  Filled 2018-10-21: qty 30

## 2018-10-21 MED ORDER — EPINEPHRINE PF 1 MG/ML IJ SOLN
INTRAOCULAR | Status: DC | PRN
  Administered 2018-10-21: 230 mL via OPHTHALMIC

## 2018-10-21 MED ORDER — MOXIFLOXACIN HCL 0.5 % OP SOLN
OPHTHALMIC | Status: DC | PRN
  Administered 2018-10-21: 0.2 mL via OPHTHALMIC

## 2018-10-21 MED ORDER — ONDANSETRON HCL 4 MG/2ML IJ SOLN: 4.0000 mg | Freq: Once | INTRAMUSCULAR | Status: DC | PRN

## 2018-10-21 MED ORDER — ARMC OPHTHALMIC DILATING DROPS
1.0000 "application " | OPHTHALMIC | Status: DC | PRN
  Administered 2018-10-21 (×3): 1 via OPHTHALMIC

## 2018-10-21 MED ORDER — BRIMONIDINE TARTRATE-TIMOLOL 0.2-0.5 % OP SOLN
OPHTHALMIC | Status: DC | PRN
  Administered 2018-10-21: 1 [drp] via OPHTHALMIC

## 2018-10-21 MED ORDER — TETRACAINE HCL 0.5 % OP SOLN
1.0000 [drp] | OPHTHALMIC | Status: DC | PRN
  Administered 2018-10-21 (×3): 1 [drp] via OPHTHALMIC

## 2018-10-21 MED ORDER — MIDAZOLAM HCL 2 MG/2ML IJ SOLN
INTRAMUSCULAR | Status: DC | PRN
  Administered 2018-10-21 (×2): 1 mg via INTRAVENOUS

## 2018-10-21 SURGICAL SUPPLY — 23 items
CANNULA ANT/CHMB 27G (MISCELLANEOUS) ×2 IMPLANT
CANNULA ANT/CHMB 27GA (MISCELLANEOUS) ×6 IMPLANT
GLOVE SURG LX 8.0 MICRO (GLOVE) ×2
GLOVE SURG LX STRL 8.0 MICRO (GLOVE) ×1 IMPLANT
GLOVE SURG TRIUMPH 8.0 PF LTX (GLOVE) ×3 IMPLANT
GOWN STRL REUS W/ TWL LRG LVL3 (GOWN DISPOSABLE) ×2 IMPLANT
GOWN STRL REUS W/TWL LRG LVL3 (GOWN DISPOSABLE) ×4
LENS IOL TECNIS ITEC 22.5 (Intraocular Lens) ×2 IMPLANT
MARKER SKIN DUAL TIP RULER LAB (MISCELLANEOUS) ×3 IMPLANT
NDL FILTER BLUNT 18X1 1/2 (NEEDLE) ×1 IMPLANT
NDL RETROBULBAR .5 NSTRL (NEEDLE) ×3 IMPLANT
NEEDLE FILTER BLUNT 18X 1/2SAF (NEEDLE) ×2
NEEDLE FILTER BLUNT 18X1 1/2 (NEEDLE) ×1 IMPLANT
PACK EYE AFTER SURG (MISCELLANEOUS) ×3 IMPLANT
PACK OPTHALMIC (MISCELLANEOUS) ×3 IMPLANT
PACK PORFILIO (MISCELLANEOUS) ×3 IMPLANT
SPONGE SURG I SPEAR (MISCELLANEOUS) ×2 IMPLANT
SUT ETHILON 10-0 CS-B-6CS-B-6 (SUTURE) ×6
SUTURE EHLN 10-0 CS-B-6CS-B-6 (SUTURE) IMPLANT
SYR 3ML LL SCALE MARK (SYRINGE) ×3 IMPLANT
SYR TB 1ML LUER SLIP (SYRINGE) ×3 IMPLANT
WATER STERILE IRR 250ML POUR (IV SOLUTION) ×3 IMPLANT
WIPE NON LINTING 3.25X3.25 (MISCELLANEOUS) ×3 IMPLANT

## 2018-10-21 NOTE — Op Note (Signed)
PREOPERATIVE DIAGNOSIS:  Nuclear sclerotic cataract of the right eye.   POSTOPERATIVE DIAGNOSIS:  H25.89 Cataract   OPERATIVE PROCEDURE: Procedure(s): CATARACT EXTRACTION PHACO AND INTRAOCULAR LENS PLACEMENT (IOC) RIGHT VISION BLUE DIABETIC  08.27.0  24.8%  126.0   SURGEON:  Birder Robson, MD.   ANESTHESIA:  Anesthesiologist: Heniser, Fredric Dine, MD CRNA: Cameron Ali, CRNA  1.      Managed anesthesia care. 2.      0.65ml of Shugarcaine was instilled in the eye following the paracentesis.   COMPLICATIONS:  2  123456 nylon sutures placed to close main incision.   TECHNIQUE:   Stop and chop   DESCRIPTION OF PROCEDURE:  The patient was examined and consented in the preoperative holding area where the aforementioned topical anesthesia was applied to the right eye and then brought back to the Operating Room where the right eye was prepped and draped in the usual sterile ophthalmic fashion and a lid speculum was placed. A paracentesis was created with the side port blade and the anterior chamber was filled with viscoelastic. A near clear corneal incision was performed with the steel keratome. A continuous curvilinear capsulorrhexis was performed with a cystotome followed by the capsulorrhexis forceps. Hydrodissection and hydrodelineation were carried out with BSS on a blunt cannula. The lens was removed in a stop and chop  technique and the remaining cortical material was removed with the irrigation-aspiration handpiece. The capsular bag was inflated with viscoelastic and the Technis [id number]  lens was placed in the capsular bag without complication. The remaining viscoelastic was removed from the eye with the irrigation-aspiration handpiece. The wounds were hydrated. The anterior chamber was flushed with Miostat and the eye was inflated to physiologic pressure. 0.41ml of Vigamox was placed in the anterior chamber. The wounds were found to be water tight. The eye was dressed with Vigamox. The patient was  given protective glasses to wear throughout the day and a shield with which to sleep tonight. The patient was also given drops with which to begin a drop regimen today and will follow-up with me in one day. Implant Name Type Inv. Item Serial No. Manufacturer Lot No. LRB No. Used Action  LENS IOL DIOP 22.5 - [id number] Intraocular Lens LENS IOL DIOP 22.5 [id number]  Right 1 Implanted   Procedure(s) with comments: CATARACT EXTRACTION PHACO AND INTRAOCULAR LENS PLACEMENT (IOC) RIGHT VISION BLUE DIABETIC  08.27.0  24.8%  126.0 (Right) - diabetic - oral meds  Electronically signed: Birder Robson 10/21/2018 8:53 AM

## 2018-10-21 NOTE — H&P (Signed)
All labs reviewed. Abnormal studies sent to patients PCP when indicated.  Previous H&P reviewed, patient examined, there are NO CHANGES.  Tracey Fragoso Porfilio9/1/20207:54 AM

## 2018-10-21 NOTE — Anesthesia Postprocedure Evaluation (Signed)
Anesthesia Post Note  Patient: Tracey Love  Procedure(s) Performed: CATARACT EXTRACTION PHACO AND INTRAOCULAR LENS PLACEMENT (IOC) RIGHT VISION BLUE DIABETIC  08.27.0  24.8%  126.0 (Right Eye)  Patient location during evaluation: PACU Anesthesia Type: MAC Level of consciousness: awake and alert Pain management: pain level controlled Vital Signs Assessment: post-procedure vital signs reviewed and stable Respiratory status: spontaneous breathing, nonlabored ventilation, respiratory function stable and patient connected to nasal cannula oxygen Cardiovascular status: stable and blood pressure returned to baseline Postop Assessment: no apparent nausea or vomiting Anesthetic complications: no    Gwynn Crossley A  [name]

## 2018-10-21 NOTE — Transfer of Care (Signed)
Immediate Anesthesia Transfer of Care Note  Patient: Tracey Love  Procedure(s) Performed: CATARACT EXTRACTION PHACO AND INTRAOCULAR LENS PLACEMENT (IOC) RIGHT VISION BLUE DIABETIC  08.27.0  24.8%  126.0 (Right Eye)  Patient Location: PACU  Anesthesia Type: MAC  Level of Consciousness: awake, alert  and patient cooperative  Airway and Oxygen Therapy: Patient Spontanous Breathing and Patient connected to supplemental oxygen  Post-op Assessment: Post-op Vital signs reviewed, Patient's Cardiovascular Status Stable, Respiratory Function Stable, Patent Airway and No signs of Nausea or vomiting  Post-op Vital Signs: Reviewed and stable  Complications: No apparent anesthesia complications

## 2018-10-21 NOTE — Anesthesia Procedure Notes (Signed)
Procedure Name: MAC Date/Time: 10/21/2018 8:00 AM Performed by: Cameron Ali, CRNA Pre-anesthesia Checklist: Patient identified, Emergency Drugs available, Suction available, Timeout performed and Patient being monitored Patient Re-evaluated:Patient Re-evaluated prior to induction Oxygen Delivery Method: Nasal cannula Placement Confirmation: positive ETCO2

## 2018-10-22 ENCOUNTER — Encounter: Payer: Self-pay | Admitting: Ophthalmology

## 2018-11-12 NOTE — Anesthesia Preprocedure Evaluation (Addendum)
Anesthesia Evaluation  Patient identified by MRN, date of birth, ID band Patient awake    Reviewed: Allergy & Precautions, NPO status , Patient's Chart, lab work & pertinent test results  History of Anesthesia Complications Negative for: history of anesthetic complications  Airway Mallampati: III   Neck ROM: Full    Dental  (+)    Pulmonary neg pulmonary ROS,    Pulmonary exam normal breath sounds clear to auscultation       Cardiovascular hypertension, Normal cardiovascular exam Rhythm:Regular Rate:Normal     Neuro/Psych negative neurological ROS     GI/Hepatic GERD  ,  Endo/Other  diabetes, Type 2  Renal/GU Renal disease (MGUS)     Musculoskeletal Gout    Abdominal   Peds  Hematology negative hematology ROS (+)   Anesthesia Other Findings   Reproductive/Obstetrics                            Anesthesia Physical Anesthesia Plan  ASA: II  Anesthesia Plan: MAC   Post-op Pain Management:    Induction: Intravenous  PONV Risk Score and Plan: 2 and TIVA and Midazolam  Airway Management Planned: Natural Airway  Additional Equipment:   Intra-op Plan:   Post-operative Plan:   Informed Consent: I have reviewed the patients History and Physical, chart, labs and discussed the procedure including the risks, benefits and alternatives for the proposed anesthesia with the patient or authorized representative who has indicated his/her understanding and acceptance.       Plan Discussed with: CRNA  Anesthesia Plan Comments:        Anesthesia Quick Evaluation

## 2018-11-14 ENCOUNTER — Other Ambulatory Visit
Admission: RE | Admit: 2018-11-14 | Discharge: 2018-11-14 | Disposition: A | Discharge status: Home / Self Care | Payer: Medicare Other | Source: Ambulatory Visit | Attending: Ophthalmology | Admitting: Ophthalmology

## 2018-11-14 DIAGNOSIS — Z01812 Encounter for preprocedural laboratory examination: Secondary | ICD-10-CM | POA: Insufficient documentation

## 2018-11-14 DIAGNOSIS — Z20828 Contact with and (suspected) exposure to other viral communicable diseases: Secondary | ICD-10-CM | POA: Diagnosis not present

## 2018-11-14 NOTE — Discharge Instructions (Signed)

## 2018-11-15 LAB — SARS CORONAVIRUS 2 (TAT 6-24 HRS): SARS Coronavirus 2: NEGATIVE

## 2018-11-18 ENCOUNTER — Ambulatory Visit: Payer: Medicare Other | Admitting: Anesthesiology

## 2018-11-18 ENCOUNTER — Encounter
Admission: RE | Disposition: A | Discharge status: Home / Self Care | Payer: Self-pay | Source: Home / Self Care | Attending: Ophthalmology

## 2018-11-18 ENCOUNTER — Emergency Department
Admission: EM | Admit: 2018-11-18 | Discharge: 2018-11-18 | Disposition: A | Discharge status: Home / Self Care | Payer: Medicare Other | Source: Home / Self Care

## 2018-11-18 ENCOUNTER — Telehealth: Payer: Self-pay | Admitting: Emergency Medicine

## 2018-11-18 ENCOUNTER — Other Ambulatory Visit: Payer: Self-pay

## 2018-11-18 ENCOUNTER — Encounter: Payer: Self-pay | Admitting: Emergency Medicine

## 2018-11-18 ENCOUNTER — Ambulatory Visit
Admission: RE | Admit: 2018-11-18 | Discharge: 2018-11-18 | Disposition: A | Discharge status: Home / Self Care | Payer: Medicare Other | Attending: Ophthalmology | Admitting: Ophthalmology

## 2018-11-18 DIAGNOSIS — M1990 Unspecified osteoarthritis, unspecified site: Secondary | ICD-10-CM | POA: Diagnosis not present

## 2018-11-18 DIAGNOSIS — Z5321 Procedure and treatment not carried out due to patient leaving prior to being seen by health care provider: Secondary | ICD-10-CM | POA: Insufficient documentation

## 2018-11-18 DIAGNOSIS — Z7982 Long term (current) use of aspirin: Secondary | ICD-10-CM | POA: Diagnosis not present

## 2018-11-18 DIAGNOSIS — I4891 Unspecified atrial fibrillation: Secondary | ICD-10-CM | POA: Insufficient documentation

## 2018-11-18 DIAGNOSIS — Z7984 Long term (current) use of oral hypoglycemic drugs: Secondary | ICD-10-CM | POA: Insufficient documentation

## 2018-11-18 DIAGNOSIS — E1136 Type 2 diabetes mellitus with diabetic cataract: Secondary | ICD-10-CM | POA: Insufficient documentation

## 2018-11-18 DIAGNOSIS — Z79899 Other long term (current) drug therapy: Secondary | ICD-10-CM | POA: Diagnosis not present

## 2018-11-18 DIAGNOSIS — Z9849 Cataract extraction status, unspecified eye: Secondary | ICD-10-CM | POA: Insufficient documentation

## 2018-11-18 DIAGNOSIS — D472 Monoclonal gammopathy: Secondary | ICD-10-CM | POA: Diagnosis not present

## 2018-11-18 DIAGNOSIS — H2512 Age-related nuclear cataract, left eye: Secondary | ICD-10-CM | POA: Insufficient documentation

## 2018-11-18 DIAGNOSIS — Z880 Allergy status to penicillin: Secondary | ICD-10-CM | POA: Diagnosis not present

## 2018-11-18 DIAGNOSIS — M109 Gout, unspecified: Secondary | ICD-10-CM | POA: Diagnosis not present

## 2018-11-18 DIAGNOSIS — I10 Essential (primary) hypertension: Secondary | ICD-10-CM | POA: Insufficient documentation

## 2018-11-18 DIAGNOSIS — Z888 Allergy status to other drugs, medicaments and biological substances status: Secondary | ICD-10-CM | POA: Insufficient documentation

## 2018-11-18 HISTORY — PX: CATARACT EXTRACTION W/PHACO: SHX586

## 2018-11-18 LAB — BASIC METABOLIC PANEL
Anion gap: 10 (ref 5–15)
BUN: 13 mg/dL (ref 8–23)
CO2: 29 mmol/L (ref 22–32)
Calcium: 10.1 mg/dL (ref 8.9–10.3)
Chloride: 96 mmol/L — ABNORMAL LOW (ref 98–111)
Creatinine, Ser: 0.79 mg/dL (ref 0.44–1.00)
GFR calc Af Amer: >60 mL/min (ref >60)
GFR calc non Af Amer: >60 mL/min (ref >60)
Glucose, Bld: 166 mg/dL — ABNORMAL HIGH (ref 70–99)
Potassium: 3.9 mmol/L (ref 3.5–5.1)
Sodium: 135 mmol/L (ref 135–145)

## 2018-11-18 LAB — CBC
HCT: 43.2 % (ref 36.0–46.0)
Hemoglobin: 14.5 g/dL (ref 12.0–15.0)
MCH: 34 pg (ref 26.0–34.0)
MCHC: 33.6 g/dL (ref 30.0–36.0)
MCV: 101.2 fL — ABNORMAL HIGH (ref 80.0–100.0)
Platelets: 184 10*3/uL (ref 150–400)
RBC: 4.27 MIL/uL (ref 3.87–5.11)
RDW: 14 % (ref 11.5–15.5)
WBC: 5.7 10*3/uL (ref 4.0–10.5)
nRBC: 0 % (ref 0.0–0.2)

## 2018-11-18 LAB — GLUCOSE, CAPILLARY
Glucose-Capillary: 104 mg/dL — ABNORMAL HIGH (ref 70–99)
Glucose-Capillary: 119 mg/dL — ABNORMAL HIGH (ref 70–99)

## 2018-11-18 SURGERY — PHACOEMULSIFICATION, CATARACT, WITH IOL INSERTION
Anesthesia: Monitor Anesthesia Care | Site: Eye | Laterality: Left

## 2018-11-18 MED ORDER — ARMC OPHTHALMIC DILATING DROPS
1.0000 "application " | OPHTHALMIC | Status: DC | PRN
  Administered 2018-11-18 (×3): 1 via OPHTHALMIC

## 2018-11-18 MED ORDER — MIDAZOLAM HCL 2 MG/2ML IJ SOLN
INTRAMUSCULAR | Status: DC | PRN
  Administered 2018-11-18 (×2): 1 mg via INTRAVENOUS

## 2018-11-18 MED ORDER — LIDOCAINE HCL (PF) 2 % IJ SOLN
INTRAOCULAR | Status: DC | PRN
  Administered 2018-11-18: 2 mL

## 2018-11-18 MED ORDER — FENTANYL CITRATE (PF) 100 MCG/2ML IJ SOLN
INTRAMUSCULAR | Status: DC | PRN
  Administered 2018-11-18 (×2): 50 ug via INTRAVENOUS

## 2018-11-18 MED ORDER — SODIUM CHLORIDE 0.9% FLUSH: 3.0000 mL | Freq: Once | INTRAVENOUS | Status: DC

## 2018-11-18 MED ORDER — EPINEPHRINE PF 1 MG/ML IJ SOLN
INTRAOCULAR | Status: DC | PRN
  Administered 2018-11-18: 107 mL via OPHTHALMIC

## 2018-11-18 MED ORDER — BRIMONIDINE TARTRATE-TIMOLOL 0.2-0.5 % OP SOLN
OPHTHALMIC | Status: DC | PRN
  Administered 2018-11-18: 1 [drp] via OPHTHALMIC

## 2018-11-18 MED ORDER — TETRACAINE HCL 0.5 % OP SOLN
1.0000 [drp] | OPHTHALMIC | Status: DC | PRN
  Administered 2018-11-18 (×3): 1 [drp] via OPHTHALMIC

## 2018-11-18 MED ORDER — ACETAMINOPHEN 160 MG/5ML PO SOLN: 325.0000 mg | ORAL | Status: DC | PRN

## 2018-11-18 MED ORDER — MOXIFLOXACIN HCL 0.5 % OP SOLN
OPHTHALMIC | Status: DC | PRN
  Administered 2018-11-18: 0.2 mL via OPHTHALMIC

## 2018-11-18 MED ORDER — ACETAMINOPHEN 325 MG PO TABS: 650.0000 mg | ORAL_TABLET | Freq: Once | ORAL | Status: DC | PRN

## 2018-11-18 MED ORDER — ONDANSETRON HCL 4 MG/2ML IJ SOLN: 4.0000 mg | Freq: Once | INTRAMUSCULAR | Status: DC | PRN

## 2018-11-18 MED ORDER — LACTATED RINGERS IV SOLN: 100.0000 mL/h | INTRAVENOUS | Status: DC

## 2018-11-18 MED ORDER — NA CHONDROIT SULF-NA HYALURON 40-17 MG/ML IO SOLN
INTRAOCULAR | Status: DC | PRN
  Administered 2018-11-18: 1 mL via INTRAOCULAR

## 2018-11-18 SURGICAL SUPPLY — 20 items
CANNULA ANT/CHMB 27G (MISCELLANEOUS) ×2 IMPLANT
CANNULA ANT/CHMB 27GA (MISCELLANEOUS) ×6 IMPLANT
GLOVE SURG LX 8.0 MICRO (GLOVE) ×2
GLOVE SURG LX STRL 8.0 MICRO (GLOVE) ×1 IMPLANT
GLOVE SURG TRIUMPH 8.0 PF LTX (GLOVE) ×3 IMPLANT
GOWN STRL REUS W/ TWL LRG LVL3 (GOWN DISPOSABLE) ×2 IMPLANT
GOWN STRL REUS W/TWL LRG LVL3 (GOWN DISPOSABLE) ×4
LENS IOL TECNIS ITEC 21.5 (Intraocular Lens) ×2 IMPLANT
MARKER SKIN DUAL TIP RULER LAB (MISCELLANEOUS) ×3 IMPLANT
NDL FILTER BLUNT 18X1 1/2 (NEEDLE) ×1 IMPLANT
NDL RETROBULBAR .5 NSTRL (NEEDLE) ×3 IMPLANT
NEEDLE FILTER BLUNT 18X 1/2SAF (NEEDLE) ×2
NEEDLE FILTER BLUNT 18X1 1/2 (NEEDLE) ×1 IMPLANT
PACK EYE AFTER SURG (MISCELLANEOUS) ×3 IMPLANT
PACK OPTHALMIC (MISCELLANEOUS) ×3 IMPLANT
PACK PORFILIO (MISCELLANEOUS) ×3 IMPLANT
SYR 3ML LL SCALE MARK (SYRINGE) ×3 IMPLANT
SYR TB 1ML LUER SLIP (SYRINGE) ×3 IMPLANT
WATER STERILE IRR 250ML POUR (IV SOLUTION) ×3 IMPLANT
WIPE NON LINTING 3.25X3.25 (MISCELLANEOUS) ×3 IMPLANT

## 2018-11-18 NOTE — Anesthesia Procedure Notes (Signed)
Procedure Name: MAC Date/Time: 11/18/2018 7:30 AM Performed by: Cameron Ali, CRNA Pre-anesthesia Checklist: Patient identified, Emergency Drugs available, Suction available, Timeout performed and Patient being monitored Patient Re-evaluated:Patient Re-evaluated prior to induction Oxygen Delivery Method: Nasal cannula Placement Confirmation: positive ETCO2

## 2018-11-18 NOTE — Op Note (Signed)
PREOPERATIVE DIAGNOSIS:  Nuclear sclerotic cataract of the left eye.   POSTOPERATIVE DIAGNOSIS:  Nuclear sclerotic cataract of the left eye.   OPERATIVE PROCEDURE:@   SURGEON:  Birder Robson, MD.   ANESTHESIA:  Anesthesiologist: Darrin Nipper, MD CRNA: Cameron Ali, CRNA  1.      Managed anesthesia care. 2.     0.58ml of Shugarcaine was instilled following the paracentesis   COMPLICATIONS:  None.   TECHNIQUE:   Stop and chop   DESCRIPTION OF PROCEDURE:  The patient was examined and consented in the preoperative holding area where the aforementioned topical anesthesia was applied to the left eye and then brought back to the Operating Room where the left eye was prepped and draped in the usual sterile ophthalmic fashion and a lid speculum was placed. A paracentesis was created with the side port blade and the anterior chamber was filled with viscoelastic. A near clear corneal incision was performed with the steel keratome. A continuous curvilinear capsulorrhexis was performed with a cystotome followed by the capsulorrhexis forceps. Hydrodissection and hydrodelineation were carried out with BSS on a blunt cannula. The lens was removed in a stop and chop  technique and the remaining cortical material was removed with the irrigation-aspiration handpiece. The capsular bag was inflated with viscoelastic and the Technis [id number] lens was placed in the capsular bag without complication. The remaining viscoelastic was removed from the eye with the irrigation-aspiration handpiece. The wounds were hydrated. The anterior chamber was flushed with BSS and the eye was inflated to physiologic pressure. 0.63ml Vigamox was placed in the anterior chamber. The wounds were found to be water tight. The eye was dressed with Combigan. The patient was given protective glasses to wear throughout the day and a shield with which to sleep tonight. The patient was also given drops with which to begin a drop regimen today and  will follow-up with me in one day. Implant Name Type Inv. Item Serial No. Manufacturer Lot No. LRB No. Used Action  LENS IOL DIOP 21.5 - [id number] Intraocular Lens LENS IOL DIOP 21.5 [id number]  Left 1 Implanted    Procedure(s) with comments: CATARACT EXTRACTION PHACO AND INTRAOCULAR LENS PLACEMENT (IOC) LEFT DIABETIC 01:18.5     15.8%    12.45 (Left) - DIABETIC  Electronically signed: Birder Robson 11/18/2018 7:54 AM

## 2018-11-18 NOTE — Anesthesia Postprocedure Evaluation (Signed)
Anesthesia Post Note  Patient: Tracey Love  Procedure(s) Performed: CATARACT EXTRACTION PHACO AND INTRAOCULAR LENS PLACEMENT (IOC) LEFT DIABETIC 01:18.5     15.8%    12.45 (Left Eye)  Patient location during evaluation: PACU Anesthesia Type: MAC Level of consciousness: awake and alert, oriented and patient cooperative Pain management: pain level controlled Vital Signs Assessment: post-procedure vital signs reviewed and stable Respiratory status: spontaneous breathing, nonlabored ventilation and respiratory function stable Cardiovascular status: blood pressure returned to baseline and stable Postop Assessment: adequate PO intake Anesthetic complications: no Comments:  Noted irregular rhythm on ECG intraoperatively.  BP and HR stable, so decided to proceed with case.  Postop 12-lead ECG showed atrial fibrillation, which is new for the patient.  Called PCP Harriett Burns, but that office was unable to see pt today.  Spoke with patient and her daughter, and both agreed to go to ER for evaluation of new-onset atrial fibrillation.  Vitals stable and patient feeling well throughout PACU stay.    Darrin Nipper

## 2018-11-18 NOTE — Transfer of Care (Signed)
Immediate Anesthesia Transfer of Care Note  Patient: Tracey Love  Procedure(s) Performed: CATARACT EXTRACTION PHACO AND INTRAOCULAR LENS PLACEMENT (IOC) LEFT DIABETIC 01:18.5     15.8%    12.45 (Left Eye)  Patient Location: PACU  Anesthesia Type: MAC  Level of Consciousness: awake, alert  and patient cooperative  Airway and Oxygen Therapy: Patient Spontanous Breathing and Patient connected to supplemental oxygen  Post-op Assessment: Post-op Vital signs reviewed, Patient's Cardiovascular Status Stable, Respiratory Function Stable, Patent Airway and No signs of Nausea or vomiting  Post-op Vital Signs: Reviewed and stable  Complications: No apparent anesthesia complications

## 2018-11-18 NOTE — Telephone Encounter (Signed)
Called patient due to lwot to inquire about condition and follow up plans. Explained that she is in atrial fib and at increased risk for stroke.  She has not called her doctor since getting home.  I asked her to call her doctor and tell her she is in atrial fib and that she left before seeing doctor.  She agrees.

## 2018-11-18 NOTE — H&P (Signed)
All labs reviewed. Abnormal studies sent to patients PCP when indicated.  Previous H&P reviewed, patient examined, there are NO CHANGES.  Tracey Nieves Porfilio9/29/20207:18 AM

## 2018-11-18 NOTE — ED Triage Notes (Signed)
Says she went to get eye surgery and they saw a fib and did ekg.  She says they already did hte surgery prior to that.  Sent here for the a fib.

## 2019-01-12 DIAGNOSIS — I480 Paroxysmal atrial fibrillation: Secondary | ICD-10-CM | POA: Insufficient documentation

## 2019-01-12 DIAGNOSIS — R0602 Shortness of breath: Secondary | ICD-10-CM | POA: Insufficient documentation

## 2019-01-12 DIAGNOSIS — E782 Mixed hyperlipidemia: Secondary | ICD-10-CM | POA: Insufficient documentation

## 2019-01-12 DIAGNOSIS — I10 Essential (primary) hypertension: Secondary | ICD-10-CM | POA: Insufficient documentation

## 2019-04-26 ENCOUNTER — Ambulatory Visit: Payer: Medicare Other

## 2019-05-02 ENCOUNTER — Other Ambulatory Visit: Payer: Self-pay

## 2019-05-02 ENCOUNTER — Ambulatory Visit: Payer: Medicare Other | Attending: Internal Medicine

## 2019-05-02 DIAGNOSIS — Z23 Encounter for immunization: Secondary | ICD-10-CM

## 2019-05-02 NOTE — Progress Notes (Signed)
   Covid-19 Vaccination Clinic  Name:  Tracey Love    MRN: [id number] DOB: Oct 26, 1940  05/02/2019  [name] was observed post Covid-19 immunization for 15 minutes without incident. She was provided with Vaccine Information Sheet and instruction to access the V-Safe system.   [name] was instructed to call 911 with any severe reactions post vaccine: Marland Kitchen Difficulty breathing  . Swelling of face and throat  . A fast heartbeat  . A bad rash all over body  . Dizziness and weakness   Immunizations Administered    Name Date Dose VIS Date Route   Pfizer COVID-19 Vaccine 05/02/2019 12:02 PM 0.3 mL 01/30/2019 Intramuscular   Manufacturer: [place]   Lot: [id number]   [place]: [id number]

## 2019-05-27 ENCOUNTER — Ambulatory Visit: Payer: Medicare Other | Attending: Internal Medicine

## 2019-05-27 DIAGNOSIS — Z23 Encounter for immunization: Secondary | ICD-10-CM

## 2019-05-27 NOTE — Progress Notes (Signed)
   Covid-19 Vaccination Clinic  Name:  Tracey Love    MRN: [id number] DOB: 1940-08-15  05/27/2019  [name] was observed post Covid-19 immunization for 15 minutes without incident. She was provided with Vaccine Information Sheet and instruction to access the V-Safe system.   [name] was instructed to call 911 with any severe reactions post vaccine: Marland Kitchen Difficulty breathing  . Swelling of face and throat  . A fast heartbeat  . A bad rash all over body  . Dizziness and weakness   Immunizations Administered    Name Date Dose VIS Date Route   Pfizer COVID-19 Vaccine 05/27/2019 10:10 AM 0.3 mL 01/30/2019 Intramuscular   Manufacturer: [place]   Lot: [id number]   [place]: [id number]

## 2019-12-01 IMAGING — US US ABDOMEN COMPLETE
2 series · 13 of 25 positions shown · non-contrast
Comparison: 11/21/2011 ultrasound.

CLINICAL DATA: 76-year-old female with elevated LFTs. Initial
encounter.

EXAM:
ABDOMEN ULTRASOUND COMPLETE

[Series 1: us abdomen complete · 0.20mm/px · 165 acquisitions, 12 frames shown (1 of 2)]
[im 1/165]
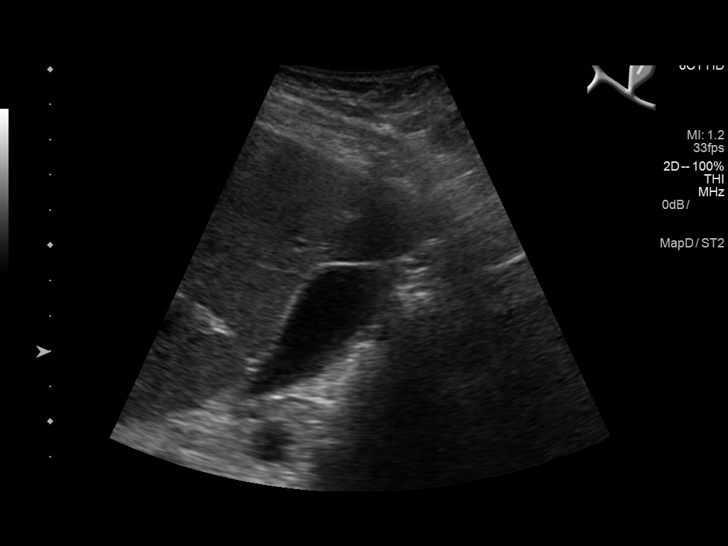
[im 15/165]
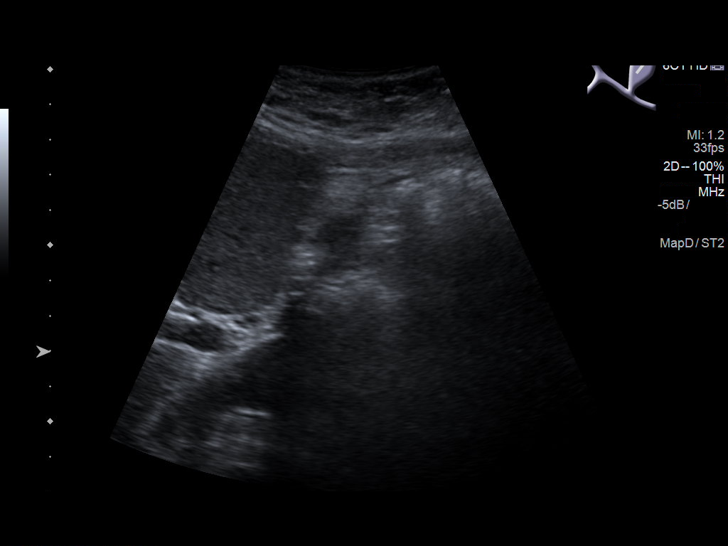
[im 29/165]
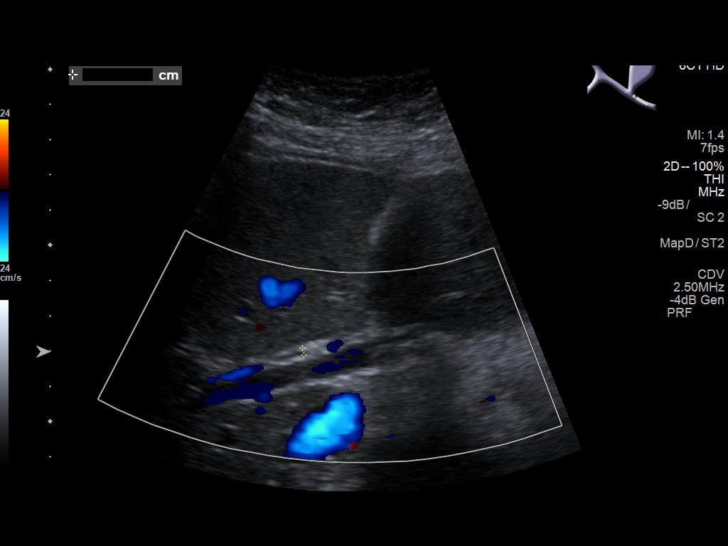
[im 43/165]
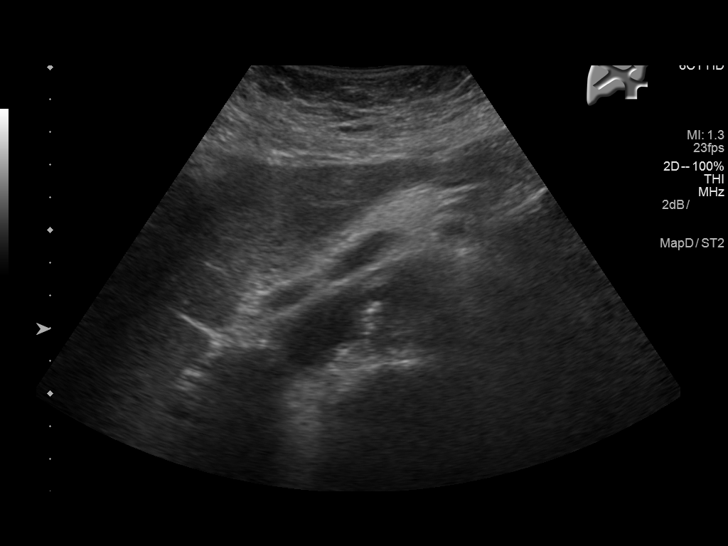
[im 58/165]
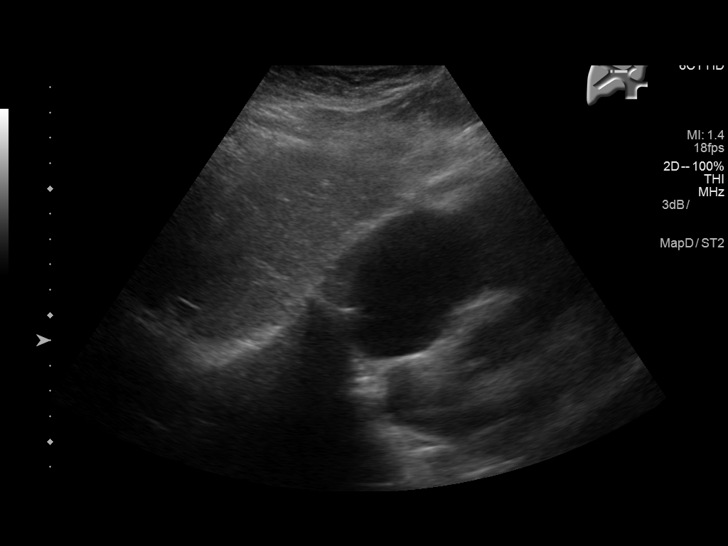
[im 72/165]
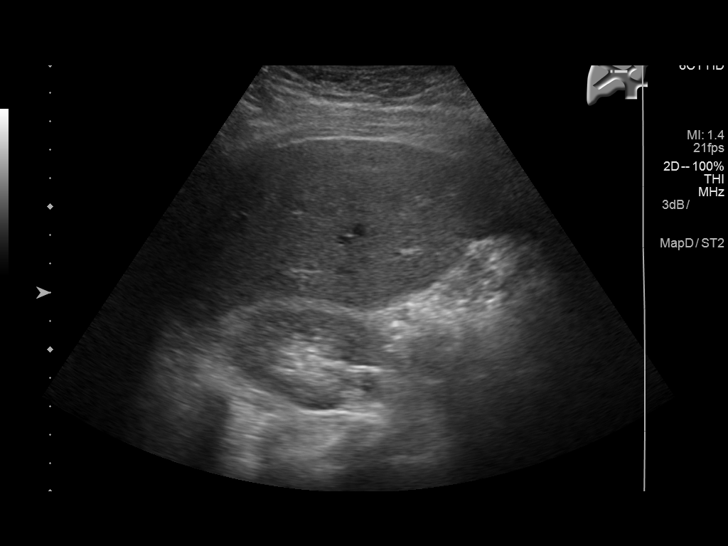
[im 86/165]
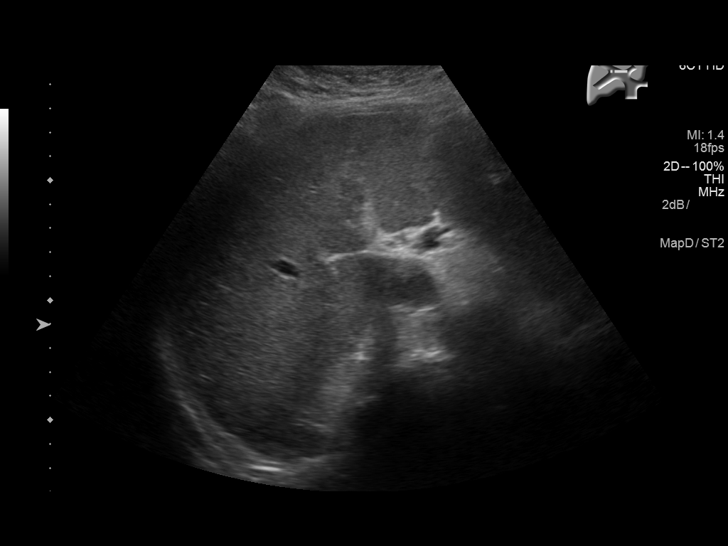
[im 100/165]
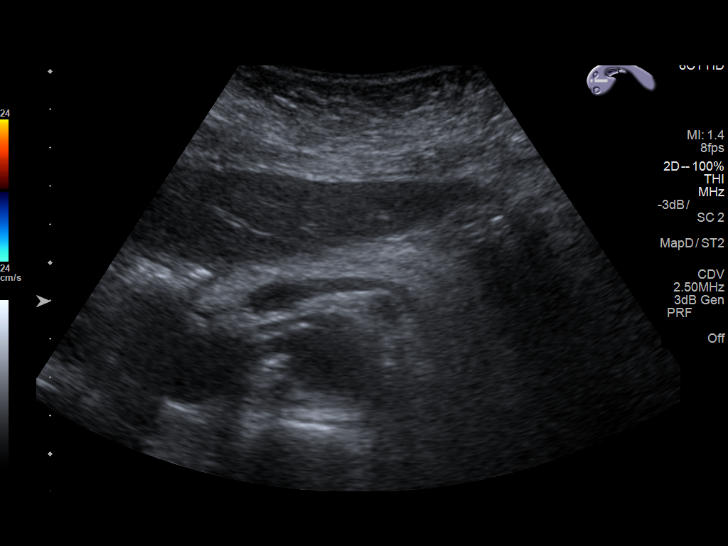
[im 115/165]
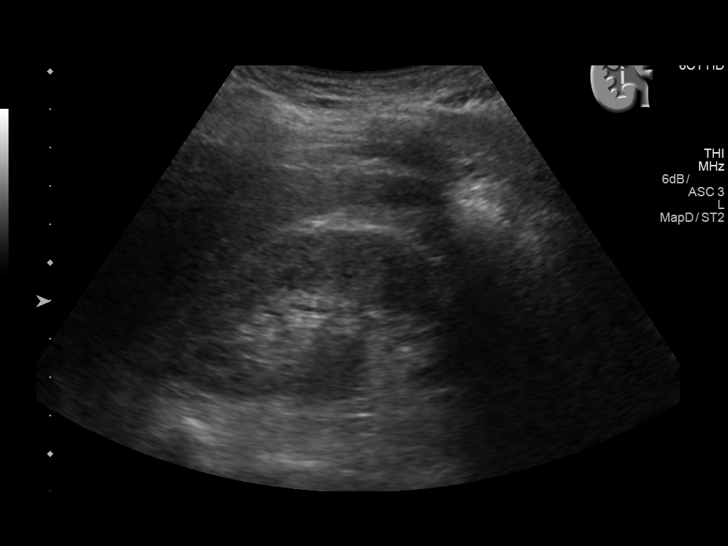
[im 129/165]
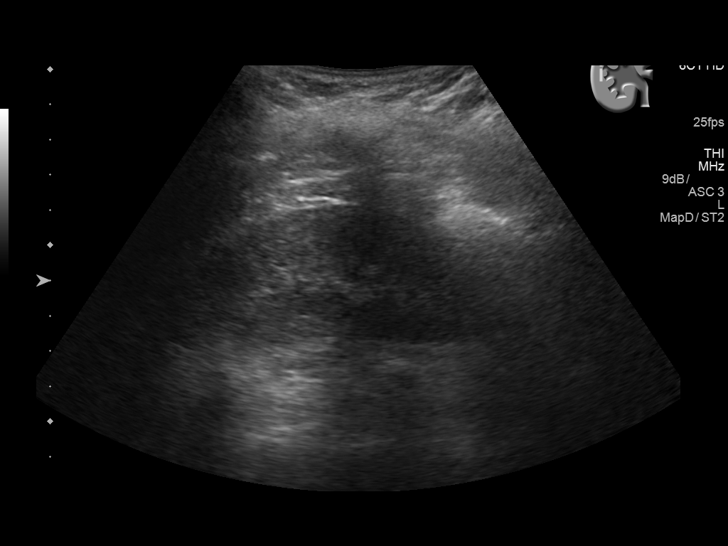
[im 143/165]
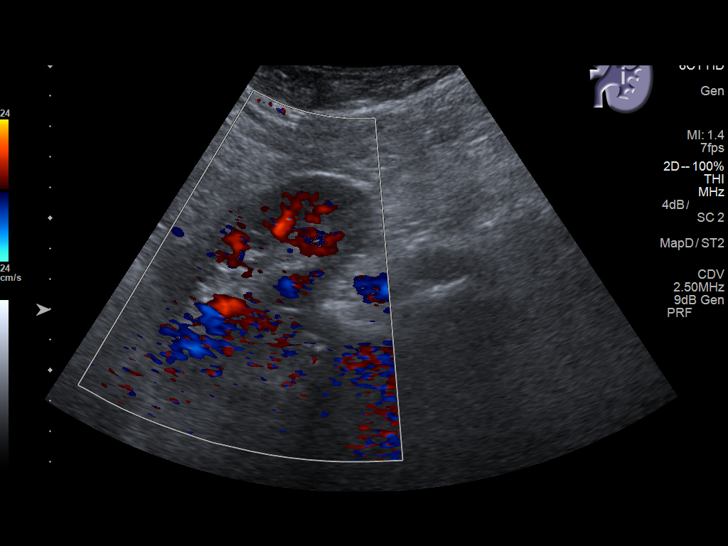
[im 157/165]
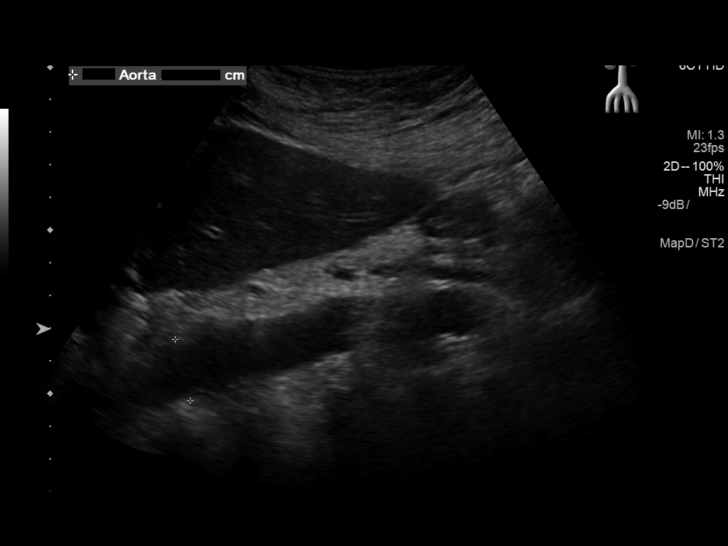

[Series 2001: us abdomen complete · 0.20mm/px · 1 of 4 slices shown (2 of 2)]
[im 1/4]
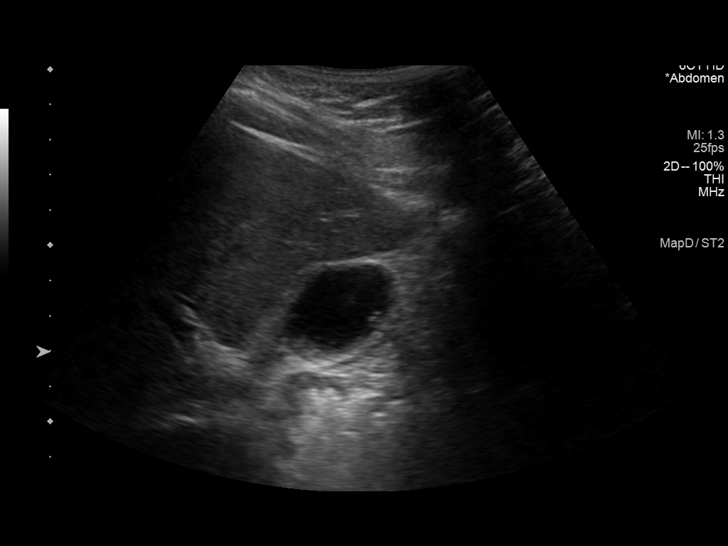

[13 of 25 positions shown; findings below may reference images not displayed]

FINDINGS: Gallbladder: 5.5 mm polyp. No gallstones. No gallbladder wall
thickening. Patient was not tender over the gallbladder during
scanning per sonographer.

Common bile duct: Diameter: 2.1 mm

Liver: No focal lesion identified. Within normal limits in
parenchymal echogenicity. Portal vein is patent on color Doppler
imaging with normal direction of blood flow towards the liver.

IVC: No abnormality visualized.

Pancreas: Visualized portion unremarkable.

Spleen: Size and appearance within normal limits.

Right Kidney: Length: 9.1 cm. Echogenicity within normal limits. No
hydronephrosis. Lower pole 7.2 mm echogenic structure possibly a non
obstructing stone or small angiomyolipoma.

Left Kidney: Length: 9.1 cm. Echogenicity within normal limits. No
mass or hydronephrosis visualized.

Abdominal aorta: Atherosclerotic changes.  No aneurysm visualized.

Other findings: None.
IMPRESSION: 5.5 mm gallbladder polyp. Polyp of this size typically benign not
requiring follow-up. No gallstones or evidence of gallbladder
inflammation.

Liver within normal limits.

7.2 mm echogenic structure lower pole right kidney may represent a
nonobstructing stone or small angiomyolipoma.

Aortic Atherosclerosis ([id number].A). No abdominal aortic aneurysm
noted.

## 2019-12-16 ENCOUNTER — Ambulatory Visit (LOCAL_COMMUNITY_HEALTH_CENTER): Payer: Self-pay

## 2019-12-16 ENCOUNTER — Other Ambulatory Visit: Payer: Self-pay

## 2019-12-16 DIAGNOSIS — Z23 Encounter for immunization: Secondary | ICD-10-CM

## 2020-04-06 DIAGNOSIS — I340 Nonrheumatic mitral (valve) insufficiency: Secondary | ICD-10-CM | POA: Insufficient documentation

## 2020-04-06 DIAGNOSIS — I272 Pulmonary hypertension, unspecified: Secondary | ICD-10-CM | POA: Insufficient documentation

## 2020-04-06 DIAGNOSIS — I071 Rheumatic tricuspid insufficiency: Secondary | ICD-10-CM | POA: Insufficient documentation

## 2020-05-11 ENCOUNTER — Other Ambulatory Visit: Payer: Self-pay | Admitting: Internal Medicine

## 2020-05-11 DIAGNOSIS — Z1231 Encounter for screening mammogram for malignant neoplasm of breast: Secondary | ICD-10-CM

## 2020-08-12 ENCOUNTER — Other Ambulatory Visit: Payer: Self-pay

## 2020-08-12 ENCOUNTER — Encounter: Payer: Self-pay | Admitting: Physician Assistant

## 2020-08-12 ENCOUNTER — Ambulatory Visit (INDEPENDENT_AMBULATORY_CARE_PROVIDER_SITE_OTHER): Payer: Medicare Other | Admitting: Physician Assistant

## 2020-08-12 DIAGNOSIS — E119 Type 2 diabetes mellitus without complications: Secondary | ICD-10-CM | POA: Diagnosis not present

## 2020-08-12 DIAGNOSIS — Z7689 Persons encountering health services in other specified circumstances: Secondary | ICD-10-CM

## 2020-08-12 DIAGNOSIS — I361 Nonrheumatic tricuspid (valve) insufficiency: Secondary | ICD-10-CM

## 2020-08-12 DIAGNOSIS — I10 Essential (primary) hypertension: Secondary | ICD-10-CM | POA: Diagnosis not present

## 2020-08-12 DIAGNOSIS — I480 Paroxysmal atrial fibrillation: Secondary | ICD-10-CM | POA: Diagnosis not present

## 2020-08-12 DIAGNOSIS — M064 Inflammatory polyarthropathy: Secondary | ICD-10-CM

## 2020-08-12 DIAGNOSIS — Z0189 Encounter for other specified special examinations: Secondary | ICD-10-CM

## 2020-08-12 DIAGNOSIS — I340 Nonrheumatic mitral (valve) insufficiency: Secondary | ICD-10-CM

## 2020-08-12 DIAGNOSIS — E782 Mixed hyperlipidemia: Secondary | ICD-10-CM

## 2020-08-12 DIAGNOSIS — I272 Pulmonary hypertension, unspecified: Secondary | ICD-10-CM

## 2020-08-12 LAB — POCT GLYCOSYLATED HEMOGLOBIN (HGB A1C): Hemoglobin A1C: 6 % — AB (ref 4.0–5.6)

## 2020-08-12 NOTE — Progress Notes (Signed)
[facility] [place], [place] [address]  Internal MEDICINE  Office Visit Note  Patient Name: Tracey Love  [id number]  [id number]  Date of Service: 08/22/2020   Complaints/HPI Pt is here for establishment of PCP. Chief Complaint  Patient presents with   New Patient (Initial Visit)    Feet swelling, going to the bathroom a lot, hands ache, difficult to pick stuff up   Quality Metric Gaps    Shingrix, covid booster   HPI Pt is here to establish care because her previous PCP is leaving. -She has a hx of afib and is on eliquis followed by Dr. Zenia Resides and Dr. Nehemiah Massed -Had an echo via cardiology office: INTERPRETATION NORMAL LEFT VENTRICULAR SYSTOLIC FUNCTION WITH AN ESTIMATED EF = >55 % NORMAL RIGHT VENTRICULAR SYSTOLIC FUNCTION MODERATE-TO-SEVERE TRICUSPID VALVE INSUFFICIENCY MODERATE MITRAL VALVE INSUFFICIENCY NO VALVULAR STENOSIS MODERATE RA ENLARGEMENT MOD RV ENLARGEMENT MILD LA ENLARGEMENT MOD PULMONARY HTN -Pt also had holter on 04/18/20 via cardiology: showing afib with variable hear rate, max 166, min 35 with avg 62bpm. Frequent PVCs. Plan to continue to monitor -OSA eval considered by cardiology based on echo and HTN and afib, however considered low risk due to lack of OSA symptoms or obesity--may need to reconsider PSG at future visit though  -Occasionally has swelling in her feet--not bad today though -On myrbetriq for her bladder. Reports they also monitor her BP -On 58m Metformin, occasionally takes 2 tabs if sugars high, but normally well controlled on 1 tab -Hx of cataract surgery last year, follows back up with Dr. PGeorge Inasoon -Lives with her son, he is 645yo. He helps cook and take care of her. It is only the two of them in the home -Takes meloxicam as needed for arthritis all over, but especially in hands and knees -Stopped smoking years ago -Does not drink alcohol -She admits she is not very active and is usually sitting around her  home  Current Medication: Outpatient Encounter Medications as of 08/12/2020  Medication Sig   acetaminophen (TYLENOL) 500 MG tablet Take 1,000 mg by mouth every 8 (eight) hours as needed for mild pain or moderate pain.    amLODipine (NORVASC) 5 MG tablet Take 5 mg by mouth daily.   aspirin EC 81 MG tablet Take 81 mg by mouth daily.   atorvastatin (LIPITOR) 40 MG tablet Take 40 mg by mouth daily.   Cholecalciferol (VITAMIN D-3) 1000 UNITS CAPS Take 1,000 Units by mouth.   CRANBERRY PO Take 4,200 mg by mouth.   ELIQUIS 5 MG TABS tablet Take 1 tablet by mouth 2 (two) times daily.   glucose blood (ONETOUCH ULTRA) test strip USE TO TEST BLOOD SUGAR DAILY   hydrochlorothiazide (HYDRODIURIL) 25 MG tablet Take 25 mg by mouth daily.   loteprednol (LOTEMAX) 0.5 % ophthalmic suspension Apply to eye.   meloxicam (MOBIC) 7.5 MG tablet Take 7.5 mg by mouth daily.   metFORMIN (GLUCOPHAGE) 500 MG tablet Take 500 mg by mouth 2 (two) times daily with a meal.   mirabegron ER (MYRBETRIQ) 25 MG TB24 tablet Take 1 tablet by mouth daily.   naproxen (NAPROSYN) 250 MG tablet Take by mouth 2 (two) times daily as needed.   omeprazole (PRILOSEC) 40 MG capsule Take 40 mg by mouth daily.   OneTouch Delica Lancets 384XMISC USE TO TEST BLOOD SUGAR ONCE DAILY   quinapril (ACCUPRIL) 10 MG tablet Take 10 mg by mouth daily.   Selenium 200 MCG CAPS Take by mouth  daily.   TURMERIC PO Take 500 mg by mouth daily.   vitamin B-12 (CYANOCOBALAMIN) 500 MCG tablet Take 500 mcg by mouth daily.   vitamin E 100 UNIT capsule Take 100 Units by mouth daily.   [DISCONTINUED] amLODipine (NORVASC) 5 MG tablet Take 1 tablet by mouth 2 (two) times daily.   [DISCONTINUED] ciclopirox (PENLAC) 8 % solution Apply topically at bedtime as needed. Apply over nail and surrounding skin. Apply daily over previous coat. After seven (7) days, may remove with alcohol and continue cycle. (Patient not taking: Reported on 08/12/2020)   [DISCONTINUED] colchicine  0.6 MG tablet Take 0.6 mg by mouth daily. Take 2 at first sign of gout flare, follow in 1 hour with 1 tab (max: 3 tabs within 1 hour) (Patient not taking: Reported on 08/12/2020)   [DISCONTINUED] oxybutynin (DITROPAN) 5 MG tablet Take 5 mg by mouth 3 (three) times daily. (Patient not taking: Reported on 08/12/2020)   No facility-administered encounter medications on file as of 08/12/2020.    Surgical History: Past Surgical History:  Procedure Laterality Date   CATARACT EXTRACTION W/PHACO Right 10/21/2018   Procedure: CATARACT EXTRACTION PHACO AND INTRAOCULAR LENS PLACEMENT (IOC) RIGHT VISION BLUE DIABETIC  08.27.0  24.8%  126.0;  Surgeon: Birder Robson, MD;  Location: [place];  Service: Ophthalmology;  Laterality: Right;  diabetic - oral meds   CATARACT EXTRACTION W/PHACO Left 11/18/2018   Procedure: CATARACT EXTRACTION PHACO AND INTRAOCULAR LENS PLACEMENT (IOC) LEFT DIABETIC 01:18.5     15.8%    12.45;  Surgeon: Birder Robson, MD;  Location: [place];  Service: Ophthalmology;  Laterality: Left;  DIABETIC   TUBAL LIGATION  1966    Medical History: Past Medical History:  Diagnosis Date   A-fib ([place])    Diabetes mellitus, type 2 ([place])    Gout    feet   Hypertension    Osteoporosis    Thyroid nodule     Family History: Family History  Problem Relation Age of Onset   Asthma Sister    Breast cancer Neg Hx     Social History   Socioeconomic History   Marital status: Divorced    Spouse name: Not on file   Number of children: Not on file   Years of education: Not on file   Highest education level: Not on file  Occupational History   Not on file  Tobacco Use   Smoking status: Never   Smokeless tobacco: Never  Vaping Use   Vaping Use: Never used  Substance and Sexual Activity   Alcohol use: Never   Drug use: Never   Sexual activity: Not on file  Other Topics Concern   Not on file  Social History Narrative   Not on file   Social Determinants of  Health   Financial Resource Strain: Not on file  Food Insecurity: Not on file  Transportation Needs: Not on file  Physical Activity: Not on file  Stress: Not on file  Social Connections: Not on file  Intimate Partner Violence: Not on file     Review of Systems  Constitutional:  Negative for chills, fatigue and unexpected weight change.  HENT:  Negative for congestion, postnasal drip, rhinorrhea, sneezing and sore throat.   Eyes:  Negative for redness.  Respiratory:  Negative for cough, chest tightness and shortness of breath.   Cardiovascular:  Positive for leg swelling. Negative for chest pain and palpitations.  Gastrointestinal:  Negative for abdominal pain, constipation, diarrhea, nausea and vomiting.  Genitourinary:  Positive for frequency. Negative for difficulty urinating, dysuria and urgency.  Musculoskeletal:  Positive for arthralgias. Negative for back pain, joint swelling and neck pain.  Skin:  Negative for rash.  Neurological: Negative.  Negative for tremors and numbness.  Hematological:  Negative for adenopathy. Does not bruise/bleed easily.  Psychiatric/Behavioral:  Negative for behavioral problems (Depression), sleep disturbance and suicidal ideas. The patient is not nervous/anxious.    Vital Signs: BP 130/64   Pulse 76   Temp 97.6 F (36.4 C)   Resp 16   Ht '5\' 4"'  (1.626 m)   Wt 167 lb 3.2 oz (75.8 kg)   SpO2 99%   BMI 28.70 kg/m    Physical Exam Vitals and nursing note reviewed.  Constitutional:      General: She is not in acute distress.    Appearance: She is well-developed. She is not diaphoretic.  HENT:     Head: Normocephalic and atraumatic.     Mouth/Throat:     Pharynx: No oropharyngeal exudate.  Eyes:     Pupils: Pupils are equal, round, and reactive to light.  Neck:     Thyroid: No thyromegaly.     Vascular: No JVD.     Trachea: No tracheal deviation.  Cardiovascular:     Rate and Rhythm: Normal rate and regular rhythm.     Heart sounds:  Normal heart sounds. No murmur heard.   No friction rub. No gallop.  Pulmonary:     Effort: Pulmonary effort is normal. No respiratory distress.     Breath sounds: No wheezing or rales.  Chest:     Chest wall: No tenderness.  Abdominal:     General: Bowel sounds are normal.     Palpations: Abdomen is soft.  Musculoskeletal:        General: Normal range of motion.     Cervical back: Normal range of motion and neck supple.     Right lower leg: No edema.     Left lower leg: Edema present.     Comments: Minimal LE edema on left foot  Lymphadenopathy:     Cervical: No cervical adenopathy.  Skin:    General: Skin is warm and dry.  Neurological:     Mental Status: She is alert and oriented to person, place, and time.     Cranial Nerves: No cranial nerve deficit.     Motor: Weakness present.  Psychiatric:        Behavior: Behavior normal.        Thought Content: Thought content normal.        Judgment: Judgment normal.      Assessment/Plan: 1. Encounter to establish care with new doctor Will order baseline labwork, reviewed current meds  2. Type 2 diabetes mellitus without complication, without long-term current use of insulin (HCC) - POCT HgB A1C is 6.0, continue on single tab of metformin--consider d/c next visit  3. Essential hypertension Continue current medications  4. Paroxysmal atrial fibrillation (HCC) Continue current--on eliquis BID, followed by cardiology  5. Inflammatory polyarthropathy of multiple sites [facility]) Extensive arthritis pain, will check an RF since pt unclear if she has been diagnosed with OA vs RA. Suspect OA and may continue meloxicam as needed, ESR, ANA, might need low dose Cymbalta  - Rheumatoid Factor  6. Mixed hyperlipidemia Continue statin, will update labs - Lipid Panel With LDL/HDL Ratio  7. Pulmonary HTN ([place]) Found on recent echo, followed by cardiology--consider PSG  8. Nonrheumatic tricuspid valve regurgitation Found on  recent echo,  followed by cardiology  9. Nonrheumatic mitral valve regurgitation Found on recent echo, followed by cardiology  10. Encounter for blood test - CBC w/Diff/Platelet - Comprehensive metabolic panel - TSH + free T4   General Counseling: Delta verbalizes understanding of the findings of todays visit and agrees with plan of treatment. I have discussed any further diagnostic evaluation that may be needed or ordered today. We also reviewed her medications today. she has been encouraged to call the office with any questions or concerns that should arise related to todays visit. Complex medical problems, medical records reviewed for continuity of care     Orders Placed This Encounter  Procedures   CBC w/Diff/Platelet   Comprehensive metabolic panel   Lipid Panel With LDL/HDL Ratio   TSH + free T4   Rheumatoid Factor   Sed Rate (ESR)   Uric acid   ANA w/Reflex if Positive   CK   POCT HgB A1C    No orders of the defined types were placed in this encounter.  Extensive chart review and discussion of multiple comorbidities and medications with patient.  This patient was seen by Drema Dallas, PA-C in collaboration with Dr. Clayborn Bigness as a part of collaborative care agreement.   Time spent:45 Minutes

## 2020-10-05 ENCOUNTER — Other Ambulatory Visit: Payer: Self-pay

## 2020-10-05 DIAGNOSIS — M064 Inflammatory polyarthropathy: Secondary | ICD-10-CM

## 2020-10-05 MED ORDER — MIRABEGRON ER 25 MG PO TB24: 25.0000 mg | ORAL_TABLET | Freq: Every day | ORAL | 0 refills | Status: DC

## 2020-10-07 LAB — ANA W/REFLEX IF POSITIVE: Anti Nuclear Antibody (ANA): NEGATIVE

## 2020-10-09 ENCOUNTER — Other Ambulatory Visit: Payer: Self-pay | Admitting: Physician Assistant

## 2020-10-13 ENCOUNTER — Ambulatory Visit (INDEPENDENT_AMBULATORY_CARE_PROVIDER_SITE_OTHER): Payer: Medicare Other | Admitting: Physician Assistant

## 2020-10-13 ENCOUNTER — Other Ambulatory Visit: Payer: Self-pay

## 2020-10-13 ENCOUNTER — Encounter: Payer: Self-pay | Admitting: Physician Assistant

## 2020-10-13 ENCOUNTER — Encounter (INDEPENDENT_AMBULATORY_CARE_PROVIDER_SITE_OTHER): Payer: Self-pay

## 2020-10-13 VITALS — BP 132/63 | HR 93 | Temp 97.8°F | Resp 16 | Ht 64.0 in | Wt 169.0 lb

## 2020-10-13 DIAGNOSIS — I480 Paroxysmal atrial fibrillation: Secondary | ICD-10-CM

## 2020-10-13 DIAGNOSIS — M064 Inflammatory polyarthropathy: Secondary | ICD-10-CM | POA: Diagnosis not present

## 2020-10-13 DIAGNOSIS — R5383 Other fatigue: Secondary | ICD-10-CM

## 2020-10-13 DIAGNOSIS — I10 Essential (primary) hypertension: Secondary | ICD-10-CM

## 2020-10-13 DIAGNOSIS — L299 Pruritus, unspecified: Secondary | ICD-10-CM

## 2020-10-13 DIAGNOSIS — E782 Mixed hyperlipidemia: Secondary | ICD-10-CM | POA: Diagnosis not present

## 2020-10-13 DIAGNOSIS — E119 Type 2 diabetes mellitus without complications: Secondary | ICD-10-CM | POA: Diagnosis not present

## 2020-10-13 DIAGNOSIS — R600 Localized edema: Secondary | ICD-10-CM

## 2020-10-13 MED ORDER — FUROSEMIDE 20 MG PO TABS: ORAL_TABLET | ORAL | 0 refills | Status: DC

## 2020-10-13 MED ORDER — HYDROXYZINE HCL 10 MG PO TABS: 10.0000 mg | ORAL_TABLET | Freq: Three times a day (TID) | ORAL | 1 refills | Status: DC | PRN

## 2020-10-13 NOTE — Progress Notes (Signed)
[facility] [place], [place] [address]  Internal MEDICINE  Office Visit Note  Patient Name: Tracey Love  [id number]  [id number]  Date of Service: 10/18/2020  Chief Complaint  Patient presents with   Follow-up   Diabetes   Hypertension   OTHER    Itching all over body    HPI Pt is here for follow up -Left foot swollen the past few weeks more so than right. Will look into compression stockings and elevate feet as able.  -Itching all over, no rash, just feels itchy especially at night. -BG fasting fluctuate 120-130 -BP at home not checked--needs new batteries -unfortunately the Lab only drew 1 of her ordered labs (ANA negative), new orders placed for remaining labs and will go get these done prior to next visit--paper copy given to patient to ensure all labs done -Continues to take mobic for her arthritis pain  Current Medication: Outpatient Encounter Medications as of 10/13/2020  Medication Sig   acetaminophen (TYLENOL) 500 MG tablet Take 1,000 mg by mouth every 8 (eight) hours as needed for mild pain or moderate pain.    amLODipine (NORVASC) 5 MG tablet Take 5 mg by mouth daily.   aspirin EC 81 MG tablet Take 81 mg by mouth daily.   atorvastatin (LIPITOR) 40 MG tablet Take 40 mg by mouth daily.   Cholecalciferol (VITAMIN D-3) 1000 UNITS CAPS Take 1,000 Units by mouth.   CRANBERRY PO Take 4,200 mg by mouth.   ELIQUIS 5 MG TABS tablet Take 1 tablet by mouth 2 (two) times daily.   furosemide (LASIX) 20 MG tablet Take 1 tab daily x3days then take only as needed for swelling   glucose blood (ONETOUCH ULTRA) test strip USE TO TEST BLOOD SUGAR DAILY   hydrochlorothiazide (HYDRODIURIL) 25 MG tablet Take 25 mg by mouth daily.   hydrOXYzine (ATARAX/VISTARIL) 10 MG tablet Take 1 tablet (10 mg total) by mouth 3 (three) times daily as needed.   loteprednol (LOTEMAX) 0.5 % ophthalmic suspension Apply to eye.   meloxicam (MOBIC) 7.5 MG tablet Take 7.5 mg by mouth  daily.   metFORMIN (GLUCOPHAGE) 500 MG tablet Take 500 mg by mouth 2 (two) times daily with a meal.   mirabegron ER (MYRBETRIQ) 25 MG TB24 tablet Take 1 tablet (25 mg total) by mouth daily.   naproxen (NAPROSYN) 250 MG tablet Take by mouth 2 (two) times daily as needed.   omeprazole (PRILOSEC) 40 MG capsule Take 40 mg by mouth daily.   OneTouch Delica Lancets 99991111 MISC USE TO TEST BLOOD SUGAR ONCE DAILY   quinapril (ACCUPRIL) 10 MG tablet Take 10 mg by mouth daily.   Selenium 200 MCG CAPS Take by mouth daily.   TURMERIC PO Take 500 mg by mouth daily.   vitamin B-12 (CYANOCOBALAMIN) 500 MCG tablet Take 500 mcg by mouth daily.   vitamin E 100 UNIT capsule Take 100 Units by mouth daily.   No facility-administered encounter medications on file as of 10/13/2020.    Surgical History: Past Surgical History:  Procedure Laterality Date   CATARACT EXTRACTION W/PHACO Right 10/21/2018   Procedure: CATARACT EXTRACTION PHACO AND INTRAOCULAR LENS PLACEMENT (IOC) RIGHT VISION BLUE DIABETIC  08.27.0  24.8%  126.0;  Surgeon: Birder Robson, MD;  Location: [place];  Service: Ophthalmology;  Laterality: Right;  diabetic - oral meds   CATARACT EXTRACTION W/PHACO Left 11/18/2018   Procedure: CATARACT EXTRACTION PHACO AND INTRAOCULAR LENS PLACEMENT (IOC) LEFT DIABETIC 01:18.5  15.8%    12.45;  Surgeon: Birder Robson, MD;  Location: [place];  Service: Ophthalmology;  Laterality: Left;  DIABETIC   TUBAL LIGATION  1966    Medical History: Past Medical History:  Diagnosis Date   A-fib ([place])    Diabetes mellitus, type 2 ([place])    Gout    feet   Hypertension    Osteoporosis    Thyroid nodule     Family History: Family History  Problem Relation Age of Onset   Asthma Sister    Breast cancer Neg Hx     Social History   Socioeconomic History   Marital status: Divorced    Spouse name: Not on file   Number of children: Not on file   Years of education: Not on file    Highest education level: Not on file  Occupational History   Not on file  Tobacco Use   Smoking status: Never   Smokeless tobacco: Never  Vaping Use   Vaping Use: Never used  Substance and Sexual Activity   Alcohol use: Never   Drug use: Never   Sexual activity: Not on file  Other Topics Concern   Not on file  Social History Narrative   Not on file   Social Determinants of Health   Financial Resource Strain: Not on file  Food Insecurity: Not on file  Transportation Needs: Not on file  Physical Activity: Not on file  Stress: Not on file  Social Connections: Not on file  Intimate Partner Violence: Not on file      Review of Systems  Constitutional:  Negative for chills, fatigue and unexpected weight change.  HENT:  Negative for congestion, postnasal drip, rhinorrhea, sneezing and sore throat.   Eyes:  Negative for redness.  Respiratory:  Negative for cough, chest tightness and shortness of breath.   Cardiovascular:  Positive for leg swelling. Negative for chest pain and palpitations.  Gastrointestinal:  Negative for abdominal pain, constipation, diarrhea, nausea and vomiting.  Genitourinary:  Negative for difficulty urinating, dysuria and urgency.  Musculoskeletal:  Positive for arthralgias. Negative for back pain, joint swelling and neck pain.  Skin:  Negative for rash.  Neurological: Negative.  Negative for tremors and numbness.  Hematological:  Negative for adenopathy. Does not bruise/bleed easily.  Psychiatric/Behavioral:  Negative for behavioral problems (Depression), sleep disturbance and suicidal ideas. The patient is not nervous/anxious.    Vital Signs: BP 132/63   Pulse 93   Temp 97.8 F (36.6 C)   Resp 16   Ht '5\' 4"'$  (1.626 m)   Wt 169 lb (76.7 kg)   SpO2 98%   BMI 29.01 kg/m    Physical Exam Vitals and nursing note reviewed.  Constitutional:      General: She is not in acute distress.    Appearance: She is well-developed. She is not diaphoretic.   HENT:     Head: Normocephalic and atraumatic.     Mouth/Throat:     Pharynx: No oropharyngeal exudate.  Eyes:     Pupils: Pupils are equal, round, and reactive to light.  Neck:     Thyroid: No thyromegaly.     Vascular: No JVD.     Trachea: No tracheal deviation.  Cardiovascular:     Rate and Rhythm: Normal rate and regular rhythm.     Heart sounds: Normal heart sounds. No murmur heard.   No friction rub. No gallop.  Pulmonary:     Effort: Pulmonary effort is normal. No respiratory distress.  Breath sounds: No wheezing or rales.  Chest:     Chest wall: No tenderness.  Abdominal:     General: Bowel sounds are normal.     Palpations: Abdomen is soft.  Musculoskeletal:        General: Normal range of motion.     Cervical back: Normal range of motion and neck supple.     Right lower leg: No edema.     Left lower leg: Edema present.     Comments: Minimal LE edema on left foot  Lymphadenopathy:     Cervical: No cervical adenopathy.  Skin:    General: Skin is warm and dry.  Neurological:     Mental Status: She is alert and oriented to person, place, and time.     Cranial Nerves: No cranial nerve deficit.     Motor: Weakness present.  Psychiatric:        Behavior: Behavior normal.        Thought Content: Thought content normal.        Judgment: Judgment normal.       Assessment/Plan: 1. Essential hypertension Stable, continue current medications  2. Inflammatory polyarthropathy of multiple sites [facility]) Will check labs and adjust therapy as indicated, continue mobic as needed--may consider cymbalta in future - Uric acid - Rheumatoid Factor - CK - Sedimentation rate  3. Type 2 diabetes mellitus without complication, without long-term current use of insulin (HCC) Well controlled, continue current medication and improving diet--may be able to d/c metformin in future  4. Paroxysmal atrial fibrillation (HCC) Followed by cardiology, on eliquis  5. Pedal edema Will  take lasix x3 days to help reduce swelling then may use prn. Pt will look into compression stockings. Did have abnormal echo recently with MR and TR through cardiology - furosemide (LASIX) 20 MG tablet; Take 1 tab daily x3days then take only as needed for swelling  Dispense: 30 tablet; Refill: 0  6. Itching Will try hydroxyzine as needed - hydrOXYzine (ATARAX/VISTARIL) 10 MG tablet; Take 1 tablet (10 mg total) by mouth 3 (three) times daily as needed.  Dispense: 30 tablet; Refill: 1  7. Mixed hyperlipidemia - Lipid Panel With LDL/HDL Ratio  8. Other fatigue - CBC w/Diff/Platelet - Comprehensive metabolic panel - TSH + free T4   General Counseling: Tera verbalizes understanding of the findings of todays visit and agrees with plan of treatment. I have discussed any further diagnostic evaluation that may be needed or ordered today. We also reviewed her medications today. she has been encouraged to call the office with any questions or concerns that should arise related to todays visit.    Orders Placed This Encounter  Procedures   CBC w/Diff/Platelet   Comprehensive metabolic panel   Lipid Panel With LDL/HDL Ratio   TSH + free T4   Sedimentation rate   Uric acid   CK   Rheumatoid Factor    Meds ordered this encounter  Medications   hydrOXYzine (ATARAX/VISTARIL) 10 MG tablet    Sig: Take 1 tablet (10 mg total) by mouth 3 (three) times daily as needed.    Dispense:  30 tablet    Refill:  1   furosemide (LASIX) 20 MG tablet    Sig: Take 1 tab daily x3days then take only as needed for swelling    Dispense:  30 tablet    Refill:  0    This patient was seen by Drema Dallas, PA-C in collaboration with Dr. Clayborn Bigness as a part of collaborative  care agreement.   Total time spent:35 Minutes Time spent includes review of chart, medications, test results, and follow up plan with the patient.      Dr Lavera Guise Internal medicine

## 2020-10-31 ENCOUNTER — Other Ambulatory Visit: Payer: Self-pay | Admitting: Physician Assistant

## 2020-10-31 ENCOUNTER — Other Ambulatory Visit: Payer: Self-pay

## 2020-10-31 DIAGNOSIS — D696 Thrombocytopenia, unspecified: Secondary | ICD-10-CM

## 2020-10-31 DIAGNOSIS — D472 Monoclonal gammopathy: Secondary | ICD-10-CM

## 2020-10-31 DIAGNOSIS — R600 Localized edema: Secondary | ICD-10-CM

## 2020-10-31 MED ORDER — MIRABEGRON ER 25 MG PO TB24: 25.0000 mg | ORAL_TABLET | Freq: Every day | ORAL | 1 refills | Status: DC

## 2020-10-31 MED ORDER — HYDROCHLOROTHIAZIDE 25 MG PO TABS: 25.0000 mg | ORAL_TABLET | Freq: Every day | ORAL | 1 refills | Status: DC

## 2020-10-31 MED ORDER — QUINAPRIL HCL 10 MG PO TABS: 10.0000 mg | ORAL_TABLET | Freq: Every day | ORAL | 1 refills | Status: DC

## 2020-11-01 ENCOUNTER — Telehealth: Payer: Self-pay

## 2020-11-01 ENCOUNTER — Other Ambulatory Visit: Payer: Self-pay

## 2020-11-01 DIAGNOSIS — D472 Monoclonal gammopathy: Secondary | ICD-10-CM

## 2020-11-01 DIAGNOSIS — D696 Thrombocytopenia, unspecified: Secondary | ICD-10-CM

## 2020-11-01 LAB — SEDIMENTATION RATE: Sed Rate: 37 mm/hr (ref 0–40)

## 2020-11-01 MED ORDER — QUINAPRIL HCL 10 MG PO TABS: 10.0000 mg | ORAL_TABLET | Freq: Every day | ORAL | 1 refills | Status: DC

## 2020-11-01 MED ORDER — MIRABEGRON ER 25 MG PO TB24: 25.0000 mg | ORAL_TABLET | Freq: Every day | ORAL | 1 refills | Status: DC

## 2020-11-01 MED ORDER — HYDROCHLOROTHIAZIDE 25 MG PO TABS: 25.0000 mg | ORAL_TABLET | Freq: Every day | ORAL | 1 refills | Status: DC

## 2020-11-01 NOTE — Telephone Encounter (Signed)
Pt called that phar don't receive her med we send called in phar they said they have power outage for few hrs they don't pres what we send yesterday called in pres 90 with 1 refills she pres

## 2020-11-02 ENCOUNTER — Telehealth: Payer: Self-pay

## 2020-11-02 LAB — COMPREHENSIVE METABOLIC PANEL
ALT: 19 IU/L (ref 0–32)
AST: 23 IU/L (ref 0–40)
Albumin/Globulin Ratio: 1.5 (ref 1.2–2.2)
Albumin: 5.1 g/dL — ABNORMAL HIGH (ref 3.7–4.7)
Alkaline Phosphatase: 177 IU/L — ABNORMAL HIGH (ref 44–121)
BUN/Creatinine Ratio: 11 — ABNORMAL LOW (ref 12–28)
BUN: 13 mg/dL (ref 8–27)
Bilirubin Total: 0.8 mg/dL (ref 0.0–1.2)
CO2: 23 mmol/L (ref 20–29)
Calcium: 10.9 mg/dL — ABNORMAL HIGH (ref 8.7–10.3)
Chloride: 95 mmol/L — ABNORMAL LOW (ref 96–106)
Creatinine, Ser: 1.18 mg/dL — ABNORMAL HIGH (ref 0.57–1.00)
Globulin, Total: 3.3 g/dL (ref 1.5–4.5)
Glucose: 187 mg/dL — ABNORMAL HIGH (ref 65–99)
Potassium: 4.2 mmol/L (ref 3.5–5.2)
Sodium: 136 mmol/L (ref 134–144)
Total Protein: 8.4 g/dL (ref 6.0–8.5)
eGFR: 47 mL/min/{1.73_m2} — ABNORMAL LOW (ref >59)

## 2020-11-02 LAB — CBC WITH DIFFERENTIAL/PLATELET
Basophils Absolute: 0.1 10*3/uL (ref 0.0–0.2)
Basos: 1 %
EOS (ABSOLUTE): 0.1 10*3/uL (ref 0.0–0.4)
Eos: 2 %
Hematocrit: 35.6 % (ref 34.0–46.6)
Hemoglobin: 12.5 g/dL (ref 11.1–15.9)
Immature Grans (Abs): 0 10*3/uL (ref 0.0–0.1)
Immature Granulocytes: 0 %
Lymphocytes Absolute: 1.9 10*3/uL (ref 0.7–3.1)
Lymphs: 32 %
MCH: 34.2 pg — ABNORMAL HIGH (ref 26.6–33.0)
MCHC: 35.1 g/dL (ref 31.5–35.7)
MCV: 98 fL — ABNORMAL HIGH (ref 79–97)
Monocytes Absolute: 0.6 10*3/uL (ref 0.1–0.9)
Monocytes: 10 %
Neutrophils Absolute: 3.3 10*3/uL (ref 1.4–7.0)
Neutrophils: 55 %
Platelets: 195 10*3/uL (ref 150–450)
RBC: 3.65 x10E6/uL — ABNORMAL LOW (ref 3.77–5.28)
RDW: 12.7 % (ref 11.7–15.4)
WBC: 6 10*3/uL (ref 3.4–10.8)

## 2020-11-02 LAB — TSH+FREE T4
Free T4: 1.42 ng/dL (ref 0.82–1.77)
TSH: 2.87 u[IU]/mL (ref 0.450–4.500)

## 2020-11-02 LAB — LIPID PANEL WITH LDL/HDL RATIO
Cholesterol, Total: 127 mg/dL (ref 100–199)
HDL: 71 mg/dL (ref >39)
LDL Chol Calc (NIH): 42 mg/dL (ref 0–99)
LDL/HDL Ratio: 0.6 ratio (ref 0.0–3.2)
Triglycerides: 69 mg/dL (ref 0–149)
VLDL Cholesterol Cal: 14 mg/dL (ref 5–40)

## 2020-11-02 LAB — URIC ACID: Uric Acid: 6.5 mg/dL (ref 3.1–7.9)

## 2020-11-02 LAB — RHEUMATOID FACTOR: Rheumatoid fact SerPl-aCnc: <10 IU/mL (ref <14.0)

## 2020-11-02 LAB — CK: Total CK: 92 U/L (ref 32–182)

## 2020-11-02 NOTE — Telephone Encounter (Signed)
Called [facility] and added [id number] and folate to labs.  [facility] will send fax to be signed

## 2020-11-07 LAB — SPECIMEN STATUS REPORT

## 2020-11-07 LAB — B12 AND FOLATE PANEL
Folate: >20 ng/mL (ref >3.0)
Vitamin B-12: 1106 pg/mL (ref 232–1245)

## 2020-11-08 ENCOUNTER — Ambulatory Visit (INDEPENDENT_AMBULATORY_CARE_PROVIDER_SITE_OTHER): Payer: Medicare Other | Admitting: Nurse Practitioner

## 2020-11-08 ENCOUNTER — Encounter: Payer: Self-pay | Admitting: Nurse Practitioner

## 2020-11-08 ENCOUNTER — Other Ambulatory Visit: Payer: Self-pay

## 2020-11-08 VITALS — BP 114/60 | HR 82 | Temp 98.0°F | Resp 16 | Ht 64.0 in | Wt 168.2 lb

## 2020-11-08 DIAGNOSIS — M064 Inflammatory polyarthropathy: Secondary | ICD-10-CM

## 2020-11-08 DIAGNOSIS — R944 Abnormal results of kidney function studies: Secondary | ICD-10-CM | POA: Diagnosis not present

## 2020-11-08 DIAGNOSIS — Z23 Encounter for immunization: Secondary | ICD-10-CM | POA: Diagnosis not present

## 2020-11-08 DIAGNOSIS — D7589 Other specified diseases of blood and blood-forming organs: Secondary | ICD-10-CM

## 2020-11-08 DIAGNOSIS — E119 Type 2 diabetes mellitus without complications: Secondary | ICD-10-CM

## 2020-11-08 LAB — POCT GLYCOSYLATED HEMOGLOBIN (HGB A1C): Hemoglobin A1C: 6 % — AB (ref 4.0–5.6)

## 2020-11-08 NOTE — Progress Notes (Signed)
[facility] [place], [place] [address]  Internal MEDICINE  Office Visit Note  Patient Name: Tracey Love  [id number]  [id number]  Date of Service: 11/08/2020  Chief Complaint  Patient presents with   Follow-up    Review labs, left foot swollen, both knees ache    Diabetes   Hypertension    HPI Tracey Love presents for a follow up visit to discuss lab results. She also reports that her left foot is swollen and both knees are achy. Her labs showed hypercalcemia, macrocytosis. Her labs also show elevated creatinine with decreased renal function. With GFR of 47. CBC shows low RBC with marcocytosis and hyperchromia. Hemoglobin and hematocrit are normal. Lipid panel, thyroid levels, uric acid and CK are normal. B12 and folate are also normal.  A1C is 6.0, stable, no change since June     Current Medication: Outpatient Encounter Medications as of 11/08/2020  Medication Sig   acetaminophen (TYLENOL) 500 MG tablet Take 1,000 mg by mouth every 8 (eight) hours as needed for mild pain or moderate pain.    amLODipine (NORVASC) 5 MG tablet Take 5 mg by mouth daily.   aspirin EC 81 MG tablet Take 81 mg by mouth daily.   atorvastatin (LIPITOR) 40 MG tablet Take 40 mg by mouth daily.   Cholecalciferol (VITAMIN D-3) 1000 UNITS CAPS Take 1,000 Units by mouth.   CRANBERRY PO Take 4,200 mg by mouth.   ELIQUIS 5 MG TABS tablet Take 1 tablet by mouth 2 (two) times daily.   furosemide (LASIX) 20 MG tablet TAKE 1 TABLET BY MOUTH EVERY DAY FOR 3 DAYS FOR SWELLING, THEN AS NEEDED   glucose blood (ONETOUCH ULTRA) test strip USE TO TEST BLOOD SUGAR DAILY   hydrochlorothiazide (HYDRODIURIL) 25 MG tablet Take 1 tablet (25 mg total) by mouth daily.   hydrOXYzine (ATARAX/VISTARIL) 10 MG tablet Take 1 tablet (10 mg total) by mouth 3 (three) times daily as needed.   loteprednol (LOTEMAX) 0.5 % ophthalmic suspension Apply to eye.   metFORMIN (GLUCOPHAGE) 500 MG tablet Take 500 mg by mouth 2  (two) times daily with a meal.   mirabegron ER (MYRBETRIQ) 25 MG TB24 tablet Take 1 tablet (25 mg total) by mouth daily.   naproxen (NAPROSYN) 250 MG tablet Take by mouth 2 (two) times daily as needed.   omeprazole (PRILOSEC) 40 MG capsule Take 40 mg by mouth daily.   OneTouch Delica Lancets 29F MISC USE TO TEST BLOOD SUGAR ONCE DAILY   quinapril (ACCUPRIL) 10 MG tablet Take 1 tablet (10 mg total) by mouth daily.   Selenium 200 MCG CAPS Take by mouth daily.   TURMERIC PO Take 500 mg by mouth daily.   vitamin B-12 (CYANOCOBALAMIN) 500 MCG tablet Take 500 mcg by mouth daily.   vitamin E 100 UNIT capsule Take 100 Units by mouth daily.   [DISCONTINUED] meloxicam (MOBIC) 7.5 MG tablet Take 7.5 mg by mouth daily. (Patient not taking: Reported on 11/08/2020)   No facility-administered encounter medications on file as of 11/08/2020.    Surgical History: Past Surgical History:  Procedure Laterality Date   CATARACT EXTRACTION W/PHACO Right 10/21/2018   Procedure: CATARACT EXTRACTION PHACO AND INTRAOCULAR LENS PLACEMENT (IOC) RIGHT VISION BLUE DIABETIC  08.27.0  24.8%  126.0;  Surgeon: Birder Robson, MD;  Location: [place];  Service: Ophthalmology;  Laterality: Right;  diabetic - oral meds   CATARACT EXTRACTION W/PHACO Left 11/18/2018   Procedure: CATARACT EXTRACTION PHACO AND INTRAOCULAR LENS PLACEMENT (  IOC) LEFT DIABETIC 01:18.5     15.8%    12.45;  Surgeon: Birder Robson, MD;  Location: [place];  Service: Ophthalmology;  Laterality: Left;  DIABETIC   TUBAL LIGATION  1966    Medical History: Past Medical History:  Diagnosis Date   A-fib ([place])    Diabetes mellitus, type 2 ([place])    Gout    feet   Hypertension    Osteoporosis    Thyroid nodule     Family History: Family History  Problem Relation Age of Onset   Asthma Sister    Breast cancer Neg Hx     Social History   Socioeconomic History   Marital status: Divorced    Spouse name: Not on file   Number  of children: Not on file   Years of education: Not on file   Highest education level: Not on file  Occupational History   Not on file  Tobacco Use   Smoking status: Never   Smokeless tobacco: Never  Vaping Use   Vaping Use: Never used  Substance and Sexual Activity   Alcohol use: Never   Drug use: Never   Sexual activity: Not on file  Other Topics Concern   Not on file  Social History Narrative   Not on file   Social Determinants of Health   Financial Resource Strain: Not on file  Food Insecurity: Not on file  Transportation Needs: Not on file  Physical Activity: Not on file  Stress: Not on file  Social Connections: Not on file  Intimate Partner Violence: Not on file      Review of Systems  Constitutional:  Negative for chills, fatigue and unexpected weight change.  HENT:  Negative for congestion, rhinorrhea, sneezing and sore throat.   Eyes:  Negative for redness.  Respiratory:  Negative for cough, chest tightness and shortness of breath.   Cardiovascular:  Negative for chest pain and palpitations.  Gastrointestinal:  Negative for abdominal pain, constipation, diarrhea, nausea and vomiting.  Genitourinary:  Negative for dysuria and frequency.  Musculoskeletal:  Negative for arthralgias, back pain, joint swelling and neck pain.  Skin:  Negative for rash.  Neurological: Negative.  Negative for tremors and numbness.  Hematological:  Negative for adenopathy. Does not bruise/bleed easily.  Psychiatric/Behavioral:  Negative for behavioral problems (Depression), sleep disturbance and suicidal ideas. The patient is not nervous/anxious.    Vital Signs: BP 114/60   Pulse 82   Temp 98 F (36.7 C)   Resp 16   Ht _0  (1.626 m)   Wt 168 lb 3.2 oz (76.3 kg)   SpO2 99%   BMI 28.87 kg/m    Physical Exam Vitals reviewed.  Constitutional:      General: She is not in acute distress.    Appearance: Normal appearance. She is normal weight. She is not ill-appearing.  HENT:      Head: Normocephalic and atraumatic.  Eyes:     Extraocular Movements: Extraocular movements intact.     Pupils: Pupils are equal, round, and reactive to light.  Cardiovascular:     Rate and Rhythm: Normal rate and regular rhythm.  Pulmonary:     Effort: Pulmonary effort is normal. No respiratory distress.  Neurological:     Mental Status: She is alert and oriented to person, place, and time.     Cranial Nerves: No cranial nerve deficit.     Coordination: Coordination normal.     Gait: Gait normal.  Psychiatric:  Mood and Affect: Mood normal.        Behavior: Behavior normal.        Assessment/Plan: 1. Type 2 diabetes mellitus without complication, without long-term current use of insulin (HCC) Stable, continue metformin as prescribed. - POCT HgB A1C  2. Inflammatory polyarthropathy of multiple sites [facility]) Continue OTC symptomatic treatment.   3. Hypercalcemia Repeat metabolic panel to assess for resolution - CMP14+EGFR  4. Abnormal renal function test Reassess renal function, lab ordered.  - CMP14+EGFR  5. Macrocytosis Additional labs ordered and repeat CBC, consider referral to hematology.  - CBC with Differential/Platelet - Iron, TIBC and Ferritin Panel  6. Needs flu shot Administered in office today.  - Flu Vaccine MDCK QUAD PF   General Counseling: Sumaiyah verbalizes understanding of the findings of todays visit and agrees with plan of treatment. I have discussed any further diagnostic evaluation that may be needed or ordered today. We also reviewed her medications today. she has been encouraged to call the office with any questions or concerns that should arise related to todays visit.    Orders Placed This Encounter  Procedures   Flu Vaccine MDCK QUAD PF   CBC with Differential/Platelet   CMP14+EGFR   Iron, TIBC and Ferritin Panel   POCT HgB A1C    No orders of the defined types were placed in this encounter.   Return in about 3 months  (around 02/07/2021) for F/U, Recheck A1C with lauren .   Total time spent:30 Minutes Time spent includes review of chart, medications, test results, and follow up plan with the patient.    Controlled Substance Database was reviewed by me.  This patient was seen by Jonetta Osgood, FNP-C in collaboration with Dr. Clayborn Bigness as a part of collaborative care agreement.   Kalyn Hofstra R. Valetta Fuller, MSN, FNP-C Internal medicine

## 2020-11-10 LAB — CBC WITH DIFFERENTIAL/PLATELET
Basophils Absolute: 0 10*3/uL (ref 0.0–0.2)
Basos: 1 %
EOS (ABSOLUTE): 0.2 10*3/uL (ref 0.0–0.4)
Eos: 2 %
Hematocrit: 35.8 % (ref 34.0–46.6)
Hemoglobin: 12.5 g/dL (ref 11.1–15.9)
Immature Grans (Abs): 0 10*3/uL (ref 0.0–0.1)
Immature Granulocytes: 0 %
Lymphocytes Absolute: 2 10*3/uL (ref 0.7–3.1)
Lymphs: 31 %
MCH: 34.8 pg — ABNORMAL HIGH (ref 26.6–33.0)
MCHC: 34.9 g/dL (ref 31.5–35.7)
MCV: 100 fL — ABNORMAL HIGH (ref 79–97)
Monocytes Absolute: 0.7 10*3/uL (ref 0.1–0.9)
Monocytes: 11 %
Neutrophils Absolute: 3.4 10*3/uL (ref 1.4–7.0)
Neutrophils: 55 %
Platelets: 188 10*3/uL (ref 150–450)
RBC: 3.59 x10E6/uL — ABNORMAL LOW (ref 3.77–5.28)
RDW: 12.9 % (ref 11.7–15.4)
WBC: 6.3 10*3/uL (ref 3.4–10.8)

## 2020-11-10 LAB — IRON,TIBC AND FERRITIN PANEL
Ferritin: 77 ng/mL (ref 15–150)
Iron Saturation: 26 % (ref 15–55)
Iron: 82 ug/dL (ref 27–139)
Total Iron Binding Capacity: 310 ug/dL (ref 250–450)
UIBC: 228 ug/dL (ref 118–369)

## 2020-11-10 LAB — CMP14+EGFR
ALT: 22 IU/L (ref 0–32)
AST: 25 IU/L (ref 0–40)
Albumin/Globulin Ratio: 1.5 (ref 1.2–2.2)
Albumin: 5 g/dL — ABNORMAL HIGH (ref 3.7–4.7)
Alkaline Phosphatase: 183 IU/L — ABNORMAL HIGH (ref 44–121)
BUN/Creatinine Ratio: 15 (ref 12–28)
BUN: 16 mg/dL (ref 8–27)
Bilirubin Total: 0.5 mg/dL (ref 0.0–1.2)
CO2: 25 mmol/L (ref 20–29)
Calcium: 10.6 mg/dL — ABNORMAL HIGH (ref 8.7–10.3)
Chloride: 96 mmol/L (ref 96–106)
Creatinine, Ser: 1.1 mg/dL — ABNORMAL HIGH (ref 0.57–1.00)
Globulin, Total: 3.3 g/dL (ref 1.5–4.5)
Glucose: 168 mg/dL — ABNORMAL HIGH (ref 65–99)
Potassium: 4.2 mmol/L (ref 3.5–5.2)
Sodium: 138 mmol/L (ref 134–144)
Total Protein: 8.3 g/dL (ref 6.0–8.5)
eGFR: 51 mL/min/{1.73_m2} — ABNORMAL LOW (ref >59)

## 2020-11-21 ENCOUNTER — Telehealth: Payer: Self-pay

## 2020-11-22 NOTE — Telephone Encounter (Signed)
Pt has appt on 11-24-20 for this per Vivien Rota

## 2020-11-24 ENCOUNTER — Encounter: Payer: Self-pay | Admitting: Nurse Practitioner

## 2020-11-24 ENCOUNTER — Other Ambulatory Visit: Payer: Self-pay

## 2020-11-24 ENCOUNTER — Encounter (INDEPENDENT_AMBULATORY_CARE_PROVIDER_SITE_OTHER): Payer: Self-pay

## 2020-11-24 ENCOUNTER — Ambulatory Visit (INDEPENDENT_AMBULATORY_CARE_PROVIDER_SITE_OTHER): Payer: Medicare Other | Admitting: Nurse Practitioner

## 2020-11-24 VITALS — BP 140/70 | HR 81 | Temp 98.0°F | Resp 16 | Ht 64.0 in | Wt 166.0 lb

## 2020-11-24 DIAGNOSIS — K219 Gastro-esophageal reflux disease without esophagitis: Secondary | ICD-10-CM

## 2020-11-24 DIAGNOSIS — D472 Monoclonal gammopathy: Secondary | ICD-10-CM

## 2020-11-24 DIAGNOSIS — M10071 Idiopathic gout, right ankle and foot: Secondary | ICD-10-CM

## 2020-11-24 DIAGNOSIS — R600 Localized edema: Secondary | ICD-10-CM

## 2020-11-24 DIAGNOSIS — E1122 Type 2 diabetes mellitus with diabetic chronic kidney disease: Secondary | ICD-10-CM

## 2020-11-24 DIAGNOSIS — D696 Thrombocytopenia, unspecified: Secondary | ICD-10-CM

## 2020-11-24 DIAGNOSIS — E782 Mixed hyperlipidemia: Secondary | ICD-10-CM | POA: Diagnosis not present

## 2020-11-24 DIAGNOSIS — D7589 Other specified diseases of blood and blood-forming organs: Secondary | ICD-10-CM

## 2020-11-24 DIAGNOSIS — L299 Pruritus, unspecified: Secondary | ICD-10-CM

## 2020-11-24 DIAGNOSIS — N1831 Chronic kidney disease, stage 3a: Secondary | ICD-10-CM

## 2020-11-24 MED ORDER — COLCHICINE 0.6 MG PO TABS: ORAL_TABLET | ORAL | 2 refills | Status: DC

## 2020-11-24 MED ORDER — FUROSEMIDE 20 MG PO TABS: ORAL_TABLET | ORAL | 0 refills | Status: DC

## 2020-11-24 MED ORDER — HYDROXYZINE HCL 10 MG PO TABS: 10.0000 mg | ORAL_TABLET | Freq: Three times a day (TID) | ORAL | 2 refills | Status: DC | PRN

## 2020-11-24 MED ORDER — SITAGLIPTIN PHOSPHATE 50 MG PO TABS: 50.0000 mg | ORAL_TABLET | Freq: Every day | ORAL | 2 refills | Status: DC

## 2020-11-24 MED ORDER — ATORVASTATIN CALCIUM 40 MG PO TABS: 40.0000 mg | ORAL_TABLET | Freq: Every day | ORAL | 1 refills | Status: DC

## 2020-11-24 MED ORDER — MELOXICAM 7.5 MG PO TABS: 7.5000 mg | ORAL_TABLET | Freq: Every day | ORAL | 2 refills | Status: DC

## 2020-11-24 MED ORDER — OMEPRAZOLE 40 MG PO CPDR: 40.0000 mg | DELAYED_RELEASE_CAPSULE | Freq: Every day | ORAL | 1 refills | Status: DC

## 2020-11-24 NOTE — Progress Notes (Signed)
[facility] [place], [place] [address]  Internal MEDICINE  Office Visit Note  Patient Name: Tracey Love  [id number]  [id number]  Date of Service: 11/24/2020  Chief Complaint  Patient presents with   Follow-up    labs   Diabetes   Hypertension    HPI Tracey Love presents for a follow up visit for diabetes, hypertension, and to discuss lab results. Her CBC was repeated and it was abnormal low RBC, macrocytosis and hyperchromia. Her creatinine remains elevated, and her GFR is slightly improved by still decreased. She has hypercalcemia but it has improved slightly. Her alkaline phosphatase is also elevated.  She is having a gout flare as well in her right foot.     Current Medication: Outpatient Encounter Medications as of 11/24/2020  Medication Sig   acetaminophen (TYLENOL) 500 MG tablet Take 1,000 mg by mouth every 8 (eight) hours as needed for mild pain or moderate pain.    amLODipine (NORVASC) 5 MG tablet Take 5 mg by mouth daily.   aspirin EC 81 MG tablet Take 81 mg by mouth daily.   Cholecalciferol (VITAMIN D-3) 1000 UNITS CAPS Take 1,000 Units by mouth.   colchicine 0.6 MG tablet Take 2 tablets on the first day of gout flare, then take 1 additional tablet 1 hour after on the first day. After the first day take 1 tablet daily until gout flare resolves. Use as needed for gout flares.   CRANBERRY PO Take 4,200 mg by mouth.   glucose blood (ONETOUCH ULTRA) test strip USE TO TEST BLOOD SUGAR DAILY   hydrochlorothiazide (HYDRODIURIL) 25 MG tablet Take 1 tablet (25 mg total) by mouth daily.   loteprednol (LOTEMAX) 0.5 % ophthalmic suspension Apply to eye.   mirabegron ER (MYRBETRIQ) 25 MG TB24 tablet Take 1 tablet (25 mg total) by mouth daily.   OneTouch Delica Lancets 02R MISC USE TO TEST BLOOD SUGAR ONCE DAILY   quinapril (ACCUPRIL) 10 MG tablet Take 1 tablet (10 mg total) by mouth daily.   Selenium 200 MCG CAPS Take by mouth daily.   sitaGLIPtin (JANUVIA) 50  MG tablet Take 1 tablet (50 mg total) by mouth daily.   TURMERIC PO Take 500 mg by mouth daily.   vitamin B-12 (CYANOCOBALAMIN) 500 MCG tablet Take 500 mcg by mouth daily.   vitamin E 100 UNIT capsule Take 100 Units by mouth daily.   [DISCONTINUED] atorvastatin (LIPITOR) 40 MG tablet Take 40 mg by mouth daily.   [DISCONTINUED] ELIQUIS 5 MG TABS tablet Take 1 tablet by mouth 2 (two) times daily.   [DISCONTINUED] furosemide (LASIX) 20 MG tablet TAKE 1 TABLET BY MOUTH EVERY DAY FOR 3 DAYS FOR SWELLING, THEN AS NEEDED   [DISCONTINUED] hydrOXYzine (ATARAX/VISTARIL) 10 MG tablet Take 1 tablet (10 mg total) by mouth 3 (three) times daily as needed.   [DISCONTINUED] metFORMIN (GLUCOPHAGE) 500 MG tablet Take 500 mg by mouth 2 (two) times daily with a meal.   [DISCONTINUED] naproxen (NAPROSYN) 250 MG tablet Take by mouth 2 (two) times daily as needed.   [DISCONTINUED] omeprazole (PRILOSEC) 40 MG capsule Take 40 mg by mouth daily.   atorvastatin (LIPITOR) 40 MG tablet Take 1 tablet (40 mg total) by mouth daily.   hydrOXYzine (ATARAX/VISTARIL) 10 MG tablet Take 1 tablet (10 mg total) by mouth 3 (three) times daily as needed.   meloxicam (MOBIC) 7.5 MG tablet Take 1 tablet (7.5 mg total) by mouth daily.   omeprazole (PRILOSEC) 40 MG capsule Take 1  capsule (40 mg total) by mouth daily.   [DISCONTINUED] furosemide (LASIX) 20 MG tablet TAKE 1 TABLET BY MOUTH EVERY DAY FOR 3 DAYS FOR SWELLING, THEN AS NEEDED   [DISCONTINUED] meloxicam (MOBIC) 7.5 MG tablet Take 1 tablet by mouth daily.   No facility-administered encounter medications on file as of 11/24/2020.    Surgical History: Past Surgical History:  Procedure Laterality Date   CATARACT EXTRACTION W/PHACO Right 10/21/2018   Procedure: CATARACT EXTRACTION PHACO AND INTRAOCULAR LENS PLACEMENT (IOC) RIGHT VISION BLUE DIABETIC  08.27.0  24.8%  126.0;  Surgeon: Birder Robson, MD;  Location: [place];  Service: Ophthalmology;  Laterality: Right;   diabetic - oral meds   CATARACT EXTRACTION W/PHACO Left 11/18/2018   Procedure: CATARACT EXTRACTION PHACO AND INTRAOCULAR LENS PLACEMENT (IOC) LEFT DIABETIC 01:18.5     15.8%    12.45;  Surgeon: Birder Robson, MD;  Location: [facility];  Service: Ophthalmology;  Laterality: Left;  DIABETIC   TUBAL LIGATION  1966    Medical History: Past Medical History:  Diagnosis Date   A-fib ([place])    Diabetes mellitus, type 2 ([place])    Gout    feet   Hypertension    Osteoporosis    Thyroid nodule     Family History: Family History  Problem Relation Age of Onset   Asthma Sister    Breast cancer Neg Hx     Social History   Socioeconomic History   Marital status: Divorced    Spouse name: Not on file   Number of children: Not on file   Years of education: Not on file   Highest education level: Not on file  Occupational History   Not on file  Tobacco Use   Smoking status: Never   Smokeless tobacco: Never  Vaping Use   Vaping Use: Never used  Substance and Sexual Activity   Alcohol use: Never   Drug use: Never   Sexual activity: Not on file  Other Topics Concern   Not on file  Social History Narrative   Not on file   Social Determinants of Health   Financial Resource Strain: Not on file  Food Insecurity: Not on file  Transportation Needs: Not on file  Physical Activity: Not on file  Stress: Not on file  Social Connections: Not on file  Intimate Partner Violence: Not on file      Review of Systems  Constitutional:  Positive for fatigue. Negative for chills and unexpected weight change.  HENT:  Negative for congestion, rhinorrhea, sneezing and sore throat.   Eyes:  Negative for redness.  Respiratory:  Negative for cough, chest tightness and shortness of breath.   Cardiovascular:  Negative for chest pain and palpitations.  Gastrointestinal:  Negative for abdominal pain, constipation, diarrhea, nausea and vomiting.  Genitourinary:  Negative for dysuria and  frequency.  Musculoskeletal:  Negative for arthralgias, back pain, joint swelling and neck pain.  Skin:  Negative for rash.  Neurological: Negative.  Negative for tremors and numbness.  Hematological:  Negative for adenopathy. Does not bruise/bleed easily.  Psychiatric/Behavioral:  Negative for behavioral problems (Depression), sleep disturbance and suicidal ideas. The patient is not nervous/anxious.    Vital Signs: BP 140/70   Pulse 81   Temp 98 F (36.7 C)   Resp 16   Ht 5\' 4"  (1.626 m)   Wt 166 lb (75.3 kg)   SpO2 99%   BMI 28.49 kg/m    Physical Exam Vitals reviewed.  Constitutional:  General: She is not in acute distress.    Appearance: Normal appearance. She is normal weight. She is not ill-appearing.  HENT:     Head: Normocephalic and atraumatic.  Eyes:     Extraocular Movements: Extraocular movements intact.     Pupils: Pupils are equal, round, and reactive to light.  Cardiovascular:     Rate and Rhythm: Normal rate and regular rhythm.  Pulmonary:     Effort: Pulmonary effort is normal. No respiratory distress.  Neurological:     Mental Status: She is alert and oriented to person, place, and time.     Cranial Nerves: No cranial nerve deficit.     Coordination: Coordination normal.     Gait: Gait normal.  Psychiatric:        Mood and Affect: Mood normal.        Behavior: Behavior normal.       Assessment/Plan: 1. Type 2 diabetes mellitus with stage 3a chronic kidney disease, without long-term current use of insulin (HCC) Metformin discontinued due to decreased kidney function, januvia prescribed. Follow up in 2 months with lauren.  - sitaGLIPtin (JANUVIA) 50 MG tablet; Take 1 tablet (50 mg total) by mouth daily.  Dispense: 30 tablet; Refill: 2  2. Acute idiopathic gout of right foot Colchicine and meloxicam prescribed.  - colchicine 0.6 MG tablet; Take 2 tablets on the first day of gout flare, then take 1 additional tablet 1 hour after on the first  day. After the first day take 1 tablet daily until gout flare resolves. Use as needed for gout flares.  Dispense: 90 tablet; Refill: 2 - meloxicam (MOBIC) 7.5 MG tablet; Take 1 tablet (7.5 mg total) by mouth daily.  Dispense: 30 tablet; Refill: 2  3. Mixed hyperlipidemia Taking atorvastatin, stable - atorvastatin (LIPITOR) 40 MG tablet; Take 1 tablet (40 mg total) by mouth daily.  Dispense: 90 tablet; Refill: 1  4. MGUS (monoclonal gammopathy of unknown significance) Referral ordered - Ambulatory referral to Hematology / Oncology  5. Macrocytosis Referral to hematology ordered - Ambulatory referral to Hematology / Oncology  6. Itching Refill ordered - hydrOXYzine (ATARAX/VISTARIL) 10 MG tablet; Take 1 tablet (10 mg total) by mouth 3 (three) times daily as needed.  Dispense: 90 tablet; Refill: 2  7. Pedal edema Lasix refill ordered   General Counseling: Tracey Love verbalizes understanding of the findings of todays visit and agrees with plan of treatment. I have discussed any further diagnostic evaluation that may be needed or ordered today. We also reviewed her medications today. she has been encouraged to call the office with any questions or concerns that should arise related to todays visit.    Orders Placed This Encounter  Procedures   Ambulatory referral to Hematology / Oncology    Meds ordered this encounter  Medications   colchicine 0.6 MG tablet    Sig: Take 2 tablets on the first day of gout flare, then take 1 additional tablet 1 hour after on the first day. After the first day take 1 tablet daily until gout flare resolves. Use as needed for gout flares.    Dispense:  90 tablet    Refill:  2   meloxicam (MOBIC) 7.5 MG tablet    Sig: Take 1 tablet (7.5 mg total) by mouth daily.    Dispense:  30 tablet    Refill:  2    Increased risk of bleeding discussed with patient since she is on eliquis, she is aware that she needs to monitor for  signs of bleeding.   sitaGLIPtin  (JANUVIA) 50 MG tablet    Sig: Take 1 tablet (50 mg total) by mouth daily.    Dispense:  30 tablet    Refill:  2   atorvastatin (LIPITOR) 40 MG tablet    Sig: Take 1 tablet (40 mg total) by mouth daily.    Dispense:  90 tablet    Refill:  1   DISCONTD: furosemide (LASIX) 20 MG tablet    Sig: TAKE 1 TABLET BY MOUTH EVERY DAY FOR 3 DAYS FOR SWELLING, THEN AS NEEDED    Dispense:  30 tablet    Refill:  0   hydrOXYzine (ATARAX/VISTARIL) 10 MG tablet    Sig: Take 1 tablet (10 mg total) by mouth 3 (three) times daily as needed.    Dispense:  90 tablet    Refill:  2   omeprazole (PRILOSEC) 40 MG capsule    Sig: Take 1 capsule (40 mg total) by mouth daily.    Dispense:  90 capsule    Refill:  1    Return in about 2 months (around 02/06/2021) for F/U with Lauren PA-C.   Total time spent:30 Minutes Time spent includes review of chart, medications, test results, and follow up plan with the patient.   [place] Controlled Substance Database was reviewed by me.  This patient was seen by Jonetta Osgood, FNP-C in collaboration with Dr. Clayborn Bigness as a part of collaborative care agreement.   Jhan Conery R. Valetta Fuller, MSN, FNP-C Internal medicine

## 2020-12-01 ENCOUNTER — Other Ambulatory Visit: Payer: Self-pay | Admitting: Physician Assistant

## 2020-12-01 DIAGNOSIS — R600 Localized edema: Secondary | ICD-10-CM

## 2020-12-04 ENCOUNTER — Other Ambulatory Visit: Payer: Self-pay | Admitting: Nurse Practitioner

## 2020-12-05 DIAGNOSIS — D7589 Other specified diseases of blood and blood-forming organs: Secondary | ICD-10-CM | POA: Insufficient documentation

## 2020-12-05 NOTE — Progress Notes (Signed)
[place]  Telephone:(336) [phone] Fax:(336) [phone]  ID: Tracey Love OB: Jul 20, 1940  MR#: [id number]  TRR#:[id number]  Patient Care Team: Carolynne Edouard as PCP - General (Physician Assistant)  CHIEF COMPLAINT: MGUS, macrocytosis  INTERVAL HISTORY: Patient was last seen in clinic in August 2017.  She is referred back to reestablish care and monitoring of her MGUS.  She currently feels well and is asymptomatic. She denies any bony pain. She has no neurologic complaints. She denies any recent fevers or illnesses. She has a good appetite and denies weight loss. She has no chest pain or shortness of breath. She denies any nausea, vomiting, constipation, or diarrhea. She has no urinary complaints.  Patient feels at her baseline offers no specific complaints today.  REVIEW OF SYSTEMS:   Review of Systems  Constitutional: Negative.  Negative for fever, malaise/fatigue and weight loss.  Respiratory: Negative.  Negative for cough and shortness of breath.   Cardiovascular: Negative.  Negative for chest pain and leg swelling.  Gastrointestinal: Negative.  Negative for abdominal pain and constipation.  Genitourinary: Negative.  Negative for dysuria.  Musculoskeletal: Negative.  Negative for back pain.  Skin: Negative.   Neurological: Negative.  Negative for sensory change, focal weakness and weakness.  Psychiatric/Behavioral: Negative.  The patient is not nervous/anxious.    As per HPI. Otherwise, a complete review of systems is negatve.  PAST MEDICAL HISTORY: Hypertension, diabetes.  PAST SURGICAL HISTORY: Negative or noncontributory.  FAMILY HISTORY: Reviewed and unchanged. No reported history of malignancy or chronic disease.     ADVANCED DIRECTIVES (Y/N):  N   HEALTH MAINTENANCE: Social History   Tobacco Use   Smoking status: Never   Smokeless tobacco: Never  Vaping Use   Vaping Use: Never used  Substance Use Topics   Alcohol use: Never   Drug  use: Never     Colonoscopy:  PAP:  Bone density:  Lipid panel:  Allergies  Allergen Reactions   Gabapentin     Upset stomach   Penicillins     Doesn't remember reaction    Current Outpatient Medications  Medication Sig Dispense Refill   acetaminophen (TYLENOL) 500 MG tablet Take 1,000 mg by mouth every 8 (eight) hours as needed for mild pain or moderate pain.      amLODipine (NORVASC) 5 MG tablet Take 5 mg by mouth daily.     aspirin EC 81 MG tablet Take 81 mg by mouth daily.     atorvastatin (LIPITOR) 40 MG tablet Take 1 tablet (40 mg total) by mouth daily. 90 tablet 1   Cholecalciferol (VITAMIN D-3) 1000 UNITS CAPS Take 1,000 Units by mouth.     colchicine 0.6 MG tablet Take 2 tablets on the first day of gout flare, then take 1 additional tablet 1 hour after on the first day. After the first day take 1 tablet daily until gout flare resolves. Use as needed for gout flares. 90 tablet 2   CRANBERRY PO Take 4,200 mg by mouth.     ELIQUIS 5 MG TABS tablet Take 1 tablet by mouth 2 (two) times daily.     furosemide (LASIX) 20 MG tablet TAKE 1 TABLET BY MOUTH EVERY DAY FOR 3 DAYS FOR SWELLING, THEN AS NEEDED 30 tablet 3   glucose blood (ONETOUCH ULTRA) test strip USE TO TEST BLOOD SUGAR DAILY     hydrochlorothiazide (HYDRODIURIL) 25 MG tablet Take 1 tablet (25 mg total) by mouth daily. 90 tablet 1   hydrOXYzine (ATARAX/VISTARIL)  10 MG tablet Take 1 tablet (10 mg total) by mouth 3 (three) times daily as needed. 90 tablet 2   loteprednol (LOTEMAX) 0.5 % ophthalmic suspension Apply to eye.     meloxicam (MOBIC) 7.5 MG tablet Take 1 tablet (7.5 mg total) by mouth daily. 30 tablet 2   mirabegron ER (MYRBETRIQ) 25 MG TB24 tablet Take 1 tablet (25 mg total) by mouth daily. 90 tablet 1   omeprazole (PRILOSEC) 40 MG capsule Take 1 capsule (40 mg total) by mouth daily. 90 capsule 1   OneTouch Delica Lancets 30Z MISC USE TO TEST BLOOD SUGAR ONCE DAILY     quinapril (ACCUPRIL) 10 MG tablet Take 1  tablet (10 mg total) by mouth daily. 90 tablet 1   Selenium 200 MCG CAPS Take by mouth daily.     sitaGLIPtin (JANUVIA) 50 MG tablet Take 1 tablet (50 mg total) by mouth daily. 30 tablet 2   TURMERIC PO Take 500 mg by mouth daily.     vitamin B-12 (CYANOCOBALAMIN) 500 MCG tablet Take 500 mcg by mouth daily.     vitamin E 100 UNIT capsule Take 100 Units by mouth daily.     No current facility-administered medications for this visit.    OBJECTIVE: There were no vitals filed for this visit.   There is no height or weight on file to calculate BMI.    ECOG FS:0 - Asymptomatic  General: Well-developed, well-nourished, no acute distress. Eyes: Pink conjunctiva, anicteric sclera. HEENT: Normocephalic, moist mucous membranes, clear oropharnyx. Lungs: Clear to auscultation bilaterally. Heart: Regular rate and rhythm. No rubs, murmurs, or gallops. Abdomen: Soft, nontender, nondistended. No organomegaly noted, normoactive bowel sounds. Musculoskeletal: No edema, cyanosis, or clubbing. Neuro: Alert, answering all questions appropriately. Cranial nerves grossly intact. Skin: No rashes or petechiae noted. Psych: Normal affect. Lymphatics: No cervical, calvicular, axillary or inguinal LAD.   LAB RESULTS:  Lab Results  Component Value Date   NA 138 11/09/2020   K 4.2 11/09/2020   CL 96 11/09/2020   CO2 25 11/09/2020   GLUCOSE 168 (H) 11/09/2020   BUN 16 11/09/2020   CREATININE 1.10 (H) 11/09/2020   CALCIUM 10.6 (H) 11/09/2020   PROT 8.3 11/09/2020   ALBUMIN 5.0 (H) 11/09/2020   AST 25 11/09/2020   ALT 22 11/09/2020   ALKPHOS 183 (H) 11/09/2020   BILITOT 0.5 11/09/2020   GFRNONAA >60 11/18/2018   GFRAA >60 11/18/2018    Lab Results  Component Value Date   WBC 6.3 11/09/2020   NEUTROABS 3.4 11/09/2020   HGB 12.5 11/09/2020   HCT 35.8 11/09/2020   MCV 100 (H) 11/09/2020   PLT 188 11/09/2020   Lab Results  Component Value Date   TOTALPROTELP 8.0 08/26/2017   ALBUMINELP 4.0  08/26/2017   A1GS 0.3 08/26/2017   A2GS 0.8 08/26/2017   BETS 1.0 08/26/2017   GAMS 2.0 (H) 08/26/2017   MSPIKE 0.6 (H) 08/26/2017   SPEI Comment 08/26/2017     STUDIES: No results found.  ASSESSMENT: MGUS, macrocytosis  PLAN:    1. MGUS: Previously noted to have IgG predominance.  Patient's most recent M spike in February 2018 was reported as 0.4 which is essentially unchanged from previous..  This was repeated today along with her immunoglobulin levels as well as kappa and lambda free light chains.  These are pending at time of dictation.  She has no evidence of endorgan damage.  New intervention is needed.  She does not require bone marrow biopsy or  metastatic bone survey.  Patient is at low risk for progressing to overt multiple myeloma, therefore continue with yearly laboratory work and evaluation.  All this was explained to the patient in detail.  I spent a total of 30 minutes face-to-face with the patient of which greater than 50% of the visit was spent in counseling and coordination of care as detailed above.  Patient expressed understanding and was in agreement with this plan. She also understands that She can call clinic at any time with any questions, concerns, or complaints.    [name], MD   12/05/2020 10:47 PM

## 2020-12-06 ENCOUNTER — Inpatient Hospital Stay: Payer: Medicare Other

## 2020-12-06 ENCOUNTER — Other Ambulatory Visit: Payer: Self-pay

## 2020-12-06 ENCOUNTER — Inpatient Hospital Stay: Payer: Medicare Other | Attending: Oncology | Admitting: Oncology

## 2020-12-06 VITALS — BP 98/68 | HR 83 | Temp 97.9°F | Resp 18 | Wt 164.6 lb

## 2020-12-06 DIAGNOSIS — I10 Essential (primary) hypertension: Secondary | ICD-10-CM | POA: Insufficient documentation

## 2020-12-06 DIAGNOSIS — D472 Monoclonal gammopathy: Secondary | ICD-10-CM | POA: Insufficient documentation

## 2020-12-06 DIAGNOSIS — E119 Type 2 diabetes mellitus without complications: Secondary | ICD-10-CM | POA: Diagnosis not present

## 2020-12-06 DIAGNOSIS — M810 Age-related osteoporosis without current pathological fracture: Secondary | ICD-10-CM | POA: Insufficient documentation

## 2020-12-06 DIAGNOSIS — D7589 Other specified diseases of blood and blood-forming organs: Secondary | ICD-10-CM | POA: Diagnosis not present

## 2020-12-06 LAB — BASIC METABOLIC PANEL
Anion gap: 10 (ref 5–15)
BUN: 16 mg/dL (ref 8–23)
CO2: 27 mmol/L (ref 22–32)
Calcium: 10 mg/dL (ref 8.9–10.3)
Chloride: 98 mmol/L (ref 98–111)
Creatinine, Ser: 1.09 mg/dL — ABNORMAL HIGH (ref 0.44–1.00)
GFR, Estimated: 52 mL/min — ABNORMAL LOW (ref >60)
Glucose, Bld: 86 mg/dL (ref 70–99)
Potassium: 3.8 mmol/L (ref 3.5–5.1)
Sodium: 135 mmol/L (ref 135–145)

## 2020-12-06 LAB — IRON AND TIBC
Iron: 81 ug/dL (ref 28–170)
Saturation Ratios: 22 % (ref 10.4–31.8)
TIBC: 377 ug/dL (ref 250–450)
UIBC: 296 ug/dL

## 2020-12-06 LAB — CBC WITH DIFFERENTIAL/PLATELET
Abs Immature Granulocytes: 0.05 10*3/uL (ref 0.00–0.07)
Basophils Absolute: 0 10*3/uL (ref 0.0–0.1)
Basophils Relative: 1 %
Eosinophils Absolute: 0.1 10*3/uL (ref 0.0–0.5)
Eosinophils Relative: 1 %
HCT: 38.1 % (ref 36.0–46.0)
Hemoglobin: 12.8 g/dL (ref 12.0–15.0)
Immature Granulocytes: 1 %
Lymphocytes Relative: 29 %
Lymphs Abs: 2.3 10*3/uL (ref 0.7–4.0)
MCH: 34.3 pg — ABNORMAL HIGH (ref 26.0–34.0)
MCHC: 33.6 g/dL (ref 30.0–36.0)
MCV: 102.1 fL — ABNORMAL HIGH (ref 80.0–100.0)
Monocytes Absolute: 0.8 10*3/uL (ref 0.1–1.0)
Monocytes Relative: 10 %
Neutro Abs: 4.6 10*3/uL (ref 1.7–7.7)
Neutrophils Relative %: 58 %
Platelets: 213 10*3/uL (ref 150–400)
RBC: 3.73 MIL/uL — ABNORMAL LOW (ref 3.87–5.11)
RDW: 13.8 % (ref 11.5–15.5)
WBC: 7.8 10*3/uL (ref 4.0–10.5)
nRBC: 0 % (ref 0.0–0.2)

## 2020-12-06 LAB — FOLATE: Folate: 27 ng/mL (ref >5.9)

## 2020-12-06 LAB — VITAMIN B12: Vitamin B-12: 844 pg/mL (ref 180–914)

## 2020-12-06 LAB — FERRITIN: Ferritin: 45 ng/mL (ref 11–307)

## 2020-12-06 NOTE — Progress Notes (Signed)
Pt has no concerns or new complaints at this time.

## 2020-12-06 NOTE — Progress Notes (Signed)
[facility]  Telephone:(336) [phone] Fax:(336) [phone]  ID: Tracey Love OB: 1940-06-13  MR#: [id number]  IEP#:[id number]  Patient Care Team: Carolynne Edouard as PCP - General (Physician Assistant)  CHIEF COMPLAINT: Macrocytosis, MGUS.  INTERVAL HISTORY: Patient last evaluated in clinic in July 2019.  She is referred back for persistent macrocytosis and follow-up of her MGUS.  She continues to feel well and remains asymptomatic. She has no neurologic complaints. She denies any recent fevers or illnesses. She has a good appetite and denies weight loss.  She denies any chest pain, shortness of breath, cough, or hemoptysis.  She denies any nausea, vomiting, constipation, or diarrhea. She has no urinary complaints.  Patient feels at her baseline offers no specific complaints today.  REVIEW OF SYSTEMS:   Review of Systems  Constitutional: Negative.  Negative for fever, malaise/fatigue and weight loss.  Respiratory: Negative.  Negative for cough, hemoptysis and shortness of breath.   Cardiovascular: Negative.  Negative for chest pain and leg swelling.  Gastrointestinal: Negative.  Negative for abdominal pain.  Genitourinary: Negative.  Negative for dysuria.  Musculoskeletal: Negative.  Negative for back pain.  Skin: Negative.  Negative for rash.  Neurological: Negative.  Negative for dizziness, focal weakness, weakness and headaches.  Psychiatric/Behavioral: Negative.  The patient is not nervous/anxious.    As per HPI. Otherwise, a complete review of systems is negative.  PAST MEDICAL HISTORY: Past Medical History:  Diagnosis Date   A-fib ([facility])    Diabetes mellitus, type 2 ([place])    Gout    feet   Hypertension    Osteoporosis    Thyroid nodule     PAST SURGICAL HISTORY: Past Surgical History:  Procedure Laterality Date   CATARACT EXTRACTION W/PHACO Right 10/21/2018   Procedure: CATARACT EXTRACTION PHACO AND INTRAOCULAR LENS PLACEMENT (IOC) RIGHT VISION  BLUE DIABETIC  08.27.0  24.8%  126.0;  Surgeon: Birder Robson, MD;  Location: [facility];  Service: Ophthalmology;  Laterality: Right;  diabetic - oral meds   CATARACT EXTRACTION W/PHACO Left 11/18/2018   Procedure: CATARACT EXTRACTION PHACO AND INTRAOCULAR LENS PLACEMENT (IOC) LEFT DIABETIC 01:18.5     15.8%    12.45;  Surgeon: Birder Robson, MD;  Location: [facility];  Service: Ophthalmology;  Laterality: Left;  DIABETIC   TUBAL LIGATION  1966    FAMILY HISTORY: Family History  Problem Relation Age of Onset   Asthma Sister    Breast cancer Neg Hx     ADVANCED DIRECTIVES (Y/N):  N  HEALTH MAINTENANCE: Social History   Tobacco Use   Smoking status: Never   Smokeless tobacco: Never  Vaping Use   Vaping Use: Never used  Substance Use Topics   Alcohol use: Never   Drug use: Never     Colonoscopy:  PAP:  Bone density:  Lipid panel:  Allergies  Allergen Reactions   Gabapentin     Upset stomach   Penicillins     Doesn't remember reaction    Current Outpatient Medications  Medication Sig Dispense Refill   furosemide (LASIX) 20 MG tablet TAKE 1 TABLET BY MOUTH EVERY DAY FOR 3 DAYS FOR SWELLING, THEN AS NEEDED 30 tablet 3   glucose blood (ONETOUCH ULTRA) test strip USE TO TEST BLOOD SUGAR DAILY     hydrochlorothiazide (HYDRODIURIL) 25 MG tablet Take 1 tablet (25 mg total) by mouth daily. 90 tablet 1   hydrOXYzine (ATARAX/VISTARIL) 10 MG tablet Take 1 tablet (10 mg total) by mouth 3 (three)  times daily as needed. 90 tablet 2   loteprednol (LOTEMAX) 0.5 % ophthalmic suspension Apply to eye.     meloxicam (MOBIC) 7.5 MG tablet Take 1 tablet (7.5 mg total) by mouth daily. 30 tablet 2   mirabegron ER (MYRBETRIQ) 25 MG TB24 tablet Take 1 tablet (25 mg total) by mouth daily. 90 tablet 1   omeprazole (PRILOSEC) 40 MG capsule Take 1 capsule (40 mg total) by mouth daily. 90 capsule 1   OneTouch Delica Lancets 68T MISC USE TO TEST BLOOD SUGAR ONCE DAILY      quinapril (ACCUPRIL) 10 MG tablet Take 1 tablet (10 mg total) by mouth daily. 90 tablet 1   Selenium 200 MCG CAPS Take by mouth daily.     sitaGLIPtin (JANUVIA) 50 MG tablet Take 1 tablet (50 mg total) by mouth daily. 30 tablet 2   TURMERIC PO Take 500 mg by mouth daily.     vitamin B-12 (CYANOCOBALAMIN) 500 MCG tablet Take 500 mcg by mouth daily.     vitamin E 100 UNIT capsule Take 100 Units by mouth daily.     acetaminophen (TYLENOL) 500 MG tablet Take 1,000 mg by mouth every 8 (eight) hours as needed for mild pain or moderate pain.      amLODipine (NORVASC) 5 MG tablet Take 5 mg by mouth daily.     aspirin EC 81 MG tablet Take 81 mg by mouth daily.     atorvastatin (LIPITOR) 40 MG tablet Take 1 tablet (40 mg total) by mouth daily. 90 tablet 1   Cholecalciferol (VITAMIN D-3) 1000 UNITS CAPS Take 1,000 Units by mouth.     colchicine 0.6 MG tablet Take 2 tablets on the first day of gout flare, then take 1 additional tablet 1 hour after on the first day. After the first day take 1 tablet daily until gout flare resolves. Use as needed for gout flares. 90 tablet 2   CRANBERRY PO Take 4,200 mg by mouth.     ELIQUIS 5 MG TABS tablet Take 1 tablet by mouth 2 (two) times daily.     No current facility-administered medications for this visit.    OBJECTIVE: Vitals:   12/06/20 1111  BP: 98/68  Pulse: 83  Resp: 18  Temp: 97.9 F (36.6 C)  SpO2: 98%     Body mass index is 28.25 kg/m.    ECOG FS:0 - Asymptomatic  General: Well-developed, well-nourished, no acute distress. Eyes: Pink conjunctiva, anicteric sclera. HEENT: Normocephalic, moist mucous membranes. Lungs: No audible wheezing or coughing. Heart: Regular rate and rhythm. Abdomen: Soft, nontender, no obvious distention. Musculoskeletal: No edema, cyanosis, or clubbing. Neuro: Alert, answering all questions appropriately. Cranial nerves grossly intact. Skin: No rashes or petechiae noted. Psych: Normal affect. Lymphatics: No cervical,  calvicular, axillary or inguinal LAD.   LAB RESULTS:  Lab Results  Component Value Date   NA 135 12/06/2020   K 3.8 12/06/2020   CL 98 12/06/2020   CO2 27 12/06/2020   GLUCOSE 86 12/06/2020   BUN 16 12/06/2020   CREATININE 1.09 (H) 12/06/2020   CALCIUM 10.0 12/06/2020   PROT 8.3 11/09/2020   ALBUMIN 5.0 (H) 11/09/2020   AST 25 11/09/2020   ALT 22 11/09/2020   ALKPHOS 183 (H) 11/09/2020   BILITOT 0.5 11/09/2020   GFRNONAA 52 (L) 12/06/2020   GFRAA >60 11/18/2018    Lab Results  Component Value Date   WBC 7.8 12/06/2020   NEUTROABS 4.6 12/06/2020   HGB 12.8 12/06/2020  HCT 38.1 12/06/2020   MCV 102.1 (H) 12/06/2020   PLT 213 12/06/2020     STUDIES: No results found.  ASSESSMENT: Macrocytosis, MGUS.  PLAN:    1.  Macrocytosis: Patient's MCV remains persistently elevated at 102, but appears to be chronic and unchanged.  She does not have underlying anemia and B12 and folate are within normal limits. [employer] myeloid panel has been ordered to assess for underlying MDS and is pending at time of dictation.  No further intervention is needed.  Patient does not require bone marrow biopsy.  Return to clinic in 6 weeks for further evaluation and discussion of her laboratory results. 2.  MGUS: Previously patient was noted to have a mild elevation of M spike of 0.2 with an IgA predominance.  Previously, kappa/lambda light chain ratio was within normal limits.  Patient has no evidence of endorgan damage.  No intervention is needed.  No bone marrow biopsy is necessary as above.  I spent a total of 45 minutes reviewing chart data, face-to-face evaluation with the patient, counseling and coordination of care as detailed above.   Patient expressed understanding and was in agreement with this plan. She also understands that She can call clinic at any time with any questions, concerns, or complaints.    [name], MD   12/06/2020 6:08 PM

## 2020-12-07 LAB — KAPPA/LAMBDA LIGHT CHAINS
Kappa free light chain: 31.9 mg/L — ABNORMAL HIGH (ref 3.3–19.4)
Kappa, lambda light chain ratio: 0.71 (ref 0.26–1.65)
Lambda free light chains: 45 mg/L — ABNORMAL HIGH (ref 5.7–26.3)

## 2020-12-07 LAB — IGG, IGA, IGM
IgA: 309 mg/dL (ref 64–422)
IgG (Immunoglobin G), Serum: 1739 mg/dL — ABNORMAL HIGH (ref 586–1602)
IgM (Immunoglobulin M), Srm: 43 mg/dL (ref 26–217)

## 2020-12-09 ENCOUNTER — Other Ambulatory Visit: Payer: Self-pay

## 2020-12-09 ENCOUNTER — Telehealth: Payer: Self-pay | Admitting: Oncology

## 2020-12-09 LAB — PROTEIN ELECTROPHORESIS, SERUM
A/G Ratio: 1.2 (ref 0.7–1.7)
Albumin ELP: 4.7 g/dL — ABNORMAL HIGH (ref 2.9–4.4)
Alpha-1-Globulin: 0.2 g/dL (ref 0.0–0.4)
Alpha-2-Globulin: 0.7 g/dL (ref 0.4–1.0)
Beta Globulin: 1 g/dL (ref 0.7–1.3)
Gamma Globulin: 1.8 g/dL (ref 0.4–1.8)
Globulin, Total: 3.8 g/dL (ref 2.2–3.9)
M-Spike, %: 0.5 g/dL — ABNORMAL HIGH
Total Protein ELP: 8.5 g/dL (ref 6.0–8.5)

## 2020-12-09 MED ORDER — ELIQUIS 5 MG PO TABS
5.0000 mg | ORAL_TABLET | Freq: Two times a day (BID) | ORAL | 3 refills | Status: DC
Start: 2020-12-09 — End: 2021-04-04

## 2020-12-09 NOTE — Telephone Encounter (Signed)
Patient aware that some of the labs are still pending at this time.  Confirmed with patient that she does have an appointment on 01/19/21 to discuss results.

## 2020-12-15 LAB — INTELLIGEN MYELOID

## 2020-12-17 ENCOUNTER — Other Ambulatory Visit: Payer: Self-pay | Admitting: Nurse Practitioner

## 2020-12-17 DIAGNOSIS — L299 Pruritus, unspecified: Secondary | ICD-10-CM

## 2021-01-16 NOTE — Progress Notes (Signed)
[name]  Telephone:(336) [phone] Fax:(336) [phone]  ID: Tracey Love OB: 1940/11/08  MR#: [id number]  UKG#:[id number]  Patient Care Team: Tracey Love as PCP - General (Physician Assistant)  CHIEF COMPLAINT: Macrocytosis, MGUS.  INTERVAL HISTORY: Patient returns to clinic today for further evaluation and discussion of her laboratory results.  She continues to feel well and remains asymptomatic. She has no neurologic complaints. She denies any recent fevers or illnesses. She has a good appetite and denies weight loss.  She denies any chest pain, shortness of breath, cough, or hemoptysis.  She denies any nausea, vomiting, constipation, or diarrhea. She has no urinary complaints.  Patient offers no specific complaints today.  REVIEW OF SYSTEMS:   Review of Systems  Constitutional: Negative.  Negative for fever, malaise/fatigue and weight loss.  Respiratory: Negative.  Negative for cough, hemoptysis and shortness of breath.   Cardiovascular: Negative.  Negative for chest pain and leg swelling.  Gastrointestinal: Negative.  Negative for abdominal pain.  Genitourinary: Negative.  Negative for dysuria.  Musculoskeletal: Negative.  Negative for back pain.  Skin: Negative.  Negative for rash.  Neurological: Negative.  Negative for dizziness, focal weakness, weakness and headaches.  Psychiatric/Behavioral: Negative.  The patient is not nervous/anxious.    As per HPI. Otherwise, a complete review of systems is negative.  PAST MEDICAL HISTORY: Past Medical History:  Diagnosis Date   A-fib ([place])    Diabetes mellitus, type 2 ([facility])    Gout    feet   Hypertension    MGUS (monoclonal gammopathy of unknown significance)    Osteoporosis    Thyroid nodule     PAST SURGICAL HISTORY: Past Surgical History:  Procedure Laterality Date   CATARACT EXTRACTION W/PHACO Right 10/21/2018   Procedure: CATARACT EXTRACTION PHACO AND INTRAOCULAR LENS PLACEMENT (IOC) RIGHT  VISION BLUE DIABETIC  08.27.0  24.8%  126.0;  Surgeon: Birder Robson, MD;  Location: [facility];  Service: Ophthalmology;  Laterality: Right;  diabetic - oral meds   CATARACT EXTRACTION W/PHACO Left 11/18/2018   Procedure: CATARACT EXTRACTION PHACO AND INTRAOCULAR LENS PLACEMENT (IOC) LEFT DIABETIC 01:18.5     15.8%    12.45;  Surgeon: Birder Robson, MD;  Location: [facility];  Service: Ophthalmology;  Laterality: Left;  DIABETIC   TUBAL LIGATION  1966    FAMILY HISTORY: Family History  Problem Relation Age of Onset   Asthma Sister    Breast cancer Neg Hx     ADVANCED DIRECTIVES (Y/N):  N  HEALTH MAINTENANCE: Social History   Tobacco Use   Smoking status: Never   Smokeless tobacco: Never  Vaping Use   Vaping Use: Never used  Substance Use Topics   Alcohol use: Never   Drug use: Never     Colonoscopy:  PAP:  Bone density:  Lipid panel:  Allergies  Allergen Reactions   Gabapentin     Upset stomach   Penicillins     Doesn't remember reaction   Rivaroxaban Other (See Comments)    Patient stated she had a lot of blood gushing in her mouth when she brushed her teeth    Current Outpatient Medications  Medication Sig Dispense Refill   acetaminophen (TYLENOL) 500 MG tablet Take 1,000 mg by mouth every 8 (eight) hours as needed for mild pain or moderate pain.      amLODipine (NORVASC) 5 MG tablet Take 5 mg by mouth daily.     aspirin EC 81 MG tablet Take 81 mg by  mouth daily.     atorvastatin (LIPITOR) 40 MG tablet Take 1 tablet (40 mg total) by mouth daily. 90 tablet 1   Cholecalciferol (VITAMIN D-3) 1000 UNITS CAPS Take 1,000 Units by mouth daily.     colchicine 0.6 MG tablet Take 2 tablets on the first day of gout flare, then take 1 additional tablet 1 hour after on the first day. After the first day take 1 tablet daily until gout flare resolves. Use as needed for gout flares. 90 tablet 2   CRANBERRY PO Take 4,200 mg by mouth daily as needed.      ELIQUIS 5 MG TABS tablet Take 1 tablet (5 mg total) by mouth 2 (two) times daily. 60 tablet 3   furosemide (LASIX) 20 MG tablet TAKE 1 TABLET BY MOUTH EVERY DAY FOR 3 DAYS FOR SWELLING, THEN AS NEEDED 30 tablet 3   glucose blood (ONETOUCH ULTRA) test strip USE TO TEST BLOOD SUGAR DAILY     hydrochlorothiazide (HYDRODIURIL) 25 MG tablet Take 1 tablet (25 mg total) by mouth daily. 90 tablet 1   hydrOXYzine (ATARAX/VISTARIL) 10 MG tablet TAKE 1 TABLET BY MOUTH THREE TIMES A DAY AS NEEDED 270 tablet 1   meloxicam (MOBIC) 7.5 MG tablet Take 1 tablet (7.5 mg total) by mouth daily. 30 tablet 2   mirabegron ER (MYRBETRIQ) 25 MG TB24 tablet Take 1 tablet (25 mg total) by mouth daily. 90 tablet 1   omeprazole (PRILOSEC) 40 MG capsule Take 1 capsule (40 mg total) by mouth daily. 90 capsule 1   OneTouch Delica Lancets 09T MISC USE TO TEST BLOOD SUGAR ONCE DAILY     quinapril (ACCUPRIL) 10 MG tablet Take 1 tablet (10 mg total) by mouth daily. 90 tablet 1   sitaGLIPtin (JANUVIA) 50 MG tablet Take 1 tablet (50 mg total) by mouth daily. 30 tablet 2   TURMERIC PO Take 500 mg by mouth daily.     vitamin B-12 (CYANOCOBALAMIN) 500 MCG tablet Take 500 mcg by mouth daily.     vitamin E 100 UNIT capsule Take 100 Units by mouth daily.     Selenium 200 MCG CAPS Take by mouth daily. (Patient not taking: Reported on 01/19/2021)     No current facility-administered medications for this visit.    OBJECTIVE: Vitals:   01/19/21 1024  BP: (!) 126/104  Pulse: 83  Resp: 16  Temp: 98.5 F (36.9 C)     Body mass index is 28.61 kg/m.    ECOG FS:0 - Asymptomatic  General: Well-developed, well-nourished, no acute distress. Eyes: Pink conjunctiva, anicteric sclera. HEENT: Normocephalic, moist mucous membranes. Lungs: No audible wheezing or coughing. Heart: Regular rate and rhythm. Abdomen: Soft, nontender, no obvious distention. Musculoskeletal: No edema, cyanosis, or clubbing. Neuro: Alert, answering all questions  appropriately. Cranial nerves grossly intact. Skin: No rashes or petechiae noted. Psych: Normal affect.   LAB RESULTS:  Lab Results  Component Value Date   NA 135 12/06/2020   K 3.8 12/06/2020   CL 98 12/06/2020   CO2 27 12/06/2020   GLUCOSE 86 12/06/2020   BUN 16 12/06/2020   CREATININE 1.09 (H) 12/06/2020   CALCIUM 10.0 12/06/2020   PROT 8.3 11/09/2020   ALBUMIN 5.0 (H) 11/09/2020   AST 25 11/09/2020   ALT 22 11/09/2020   ALKPHOS 183 (H) 11/09/2020   BILITOT 0.5 11/09/2020   GFRNONAA 52 (L) 12/06/2020   GFRAA >60 11/18/2018    Lab Results  Component Value Date   WBC 7.8 12/06/2020  NEUTROABS 4.6 12/06/2020   HGB 12.8 12/06/2020   HCT 38.1 12/06/2020   MCV 102.1 (H) 12/06/2020   PLT 213 12/06/2020     STUDIES: No results found.  ASSESSMENT: Macrocytosis, MGUS.  PLAN:    1.  Macrocytosis: Patient's MCV remains persistently elevated at 102, but appears to be chronic and unchanged.  She does not have underlying anemia and B12 and folate are within normal limits. [employer] myeloid panel did reveal a variant of unknown clinical significance.  No further intervention is needed.  Patient does not require bone marrow biopsy.  After lengthy discussion with the patient, is agreed upon that no further follow-up is necessary.   2.  MGUS: Patient's M spike and immunoglobulins are elevated, but essentially unchanged.  Currently her M spike is 0.5 and was noted to be 0.4 in September 2016.  IgG level is 1739, which is actually decreased over 3 years ago.  She has no evidence of endorgan damage.  No intervention is needed at this time.  Patient does not require a bone marrow biopsy.  Given the chronicity, risk of progression to myeloma is minimal.  No further follow-up is necessary as above.    I spent a total of 20 minutes reviewing chart data, face-to-face evaluation with the patient, counseling and coordination of care as detailed above.    Patient expressed understanding  and was in agreement with this plan. She also understands that She can call clinic at any time with any questions, concerns, or complaints.    [name], MD   01/19/2021 2:02 PM

## 2021-01-19 ENCOUNTER — Inpatient Hospital Stay: Payer: Medicare Other | Attending: Oncology | Admitting: Oncology

## 2021-01-19 ENCOUNTER — Encounter: Payer: Self-pay | Admitting: Oncology

## 2021-01-19 ENCOUNTER — Other Ambulatory Visit: Payer: Self-pay

## 2021-01-19 VITALS — BP 126/104 | HR 83 | Temp 98.5°F | Resp 16 | Ht 64.0 in | Wt 166.7 lb

## 2021-01-19 DIAGNOSIS — D7589 Other specified diseases of blood and blood-forming organs: Secondary | ICD-10-CM | POA: Diagnosis not present

## 2021-01-19 DIAGNOSIS — D472 Monoclonal gammopathy: Secondary | ICD-10-CM | POA: Diagnosis not present

## 2021-01-19 NOTE — Progress Notes (Signed)
No concerns for pt

## 2021-02-06 ENCOUNTER — Ambulatory Visit (INDEPENDENT_AMBULATORY_CARE_PROVIDER_SITE_OTHER): Payer: Medicare Other | Admitting: Physician Assistant

## 2021-02-06 ENCOUNTER — Telehealth: Payer: Self-pay

## 2021-02-06 ENCOUNTER — Other Ambulatory Visit: Payer: Self-pay

## 2021-02-06 ENCOUNTER — Encounter: Payer: Self-pay | Admitting: Physician Assistant

## 2021-02-06 VITALS — BP 142/60 | HR 82 | Temp 97.8°F | Resp 16 | Ht 64.0 in | Wt 165.0 lb

## 2021-02-06 DIAGNOSIS — M25562 Pain in left knee: Secondary | ICD-10-CM

## 2021-02-06 DIAGNOSIS — M25561 Pain in right knee: Secondary | ICD-10-CM | POA: Diagnosis not present

## 2021-02-06 DIAGNOSIS — N1831 Chronic kidney disease, stage 3a: Secondary | ICD-10-CM

## 2021-02-06 DIAGNOSIS — G8929 Other chronic pain: Secondary | ICD-10-CM

## 2021-02-06 DIAGNOSIS — E1122 Type 2 diabetes mellitus with diabetic chronic kidney disease: Secondary | ICD-10-CM | POA: Diagnosis not present

## 2021-02-06 DIAGNOSIS — I10 Essential (primary) hypertension: Secondary | ICD-10-CM

## 2021-02-06 DIAGNOSIS — D472 Monoclonal gammopathy: Secondary | ICD-10-CM

## 2021-02-06 DIAGNOSIS — D7589 Other specified diseases of blood and blood-forming organs: Secondary | ICD-10-CM

## 2021-02-06 LAB — POCT GLYCOSYLATED HEMOGLOBIN (HGB A1C): Hemoglobin A1C: 6 % — AB (ref 4.0–5.6)

## 2021-02-06 MED ORDER — AMLODIPINE BESYLATE 5 MG PO TABS: 5.0000 mg | ORAL_TABLET | Freq: Every day | ORAL | 1 refills | Status: DC

## 2021-02-06 MED ORDER — ONETOUCH DELICA LANCETS 33G MISC: 3 refills | Status: DC

## 2021-02-06 MED ORDER — ONETOUCH ULTRA VI STRP: ORAL_STRIP | 3 refills | Status: DC

## 2021-02-06 NOTE — Telephone Encounter (Signed)
Ortho referral sent via [facility] to [facility]

## 2021-02-06 NOTE — Telephone Encounter (Signed)
Ortho scheduled for 02/28/21 @ 9:00-[place]

## 2021-02-06 NOTE — Progress Notes (Signed)
[facility] [place], [place] [address]  Internal MEDICINE  Office Visit Note  Patient Name: Tracey Love  [id number]  [id number]  Date of Service: 02/06/2021  Chief Complaint  Patient presents with   Follow-up   Diabetes   Hypertension   Knee Pain    Referral for orthopedist    HPI Pt is here for routine follow up -She had been referred to heme/onc due to abnormal labs and hx of MGUS however per their notes she is stable and does not require further follow up. -Checks BG but unsure numbers, taking jardiance without any problems -BP not checked at home due to cuff being out of batteries. Will replace and start checking as BP is borderline in office. Additionally asked her to check that she has been taking HCTZ, quinapril and amlodipine, as amlodipine has never been filled by our office. She will call office to let us know if she is taking it/needs refill. States it may have been cardiology who started her on this but they are not following her anymore. -She does have a lot of pain in both knees chronically, states she thinks she used to receive injections in knees. She takes tylenol or meloxicam as needed. Will refer to ortho for further management.  Current Medication: Outpatient Encounter Medications as of 02/06/2021  Medication Sig   acetaminophen (TYLENOL) 500 MG tablet Take 1,000 mg by mouth every 8 (eight) hours as needed for mild pain or moderate pain.    amLODipine (NORVASC) 5 MG tablet Take 5 mg by mouth daily.   aspirin EC 81 MG tablet Take 81 mg by mouth daily.   atorvastatin (LIPITOR) 40 MG tablet Take 1 tablet (40 mg total) by mouth daily.   Cholecalciferol (VITAMIN D-3) 1000 UNITS CAPS Take 1,000 Units by mouth daily.   colchicine 0.6 MG tablet Take 2 tablets on the first day of gout flare, then take 1 additional tablet 1 hour after on the first day. After the first day take 1 tablet daily until gout flare resolves. Use as needed for gout  flares.   CRANBERRY PO Take 4,200 mg by mouth daily as needed.   ELIQUIS 5 MG TABS tablet Take 1 tablet (5 mg total) by mouth 2 (two) times daily.   furosemide (LASIX) 20 MG tablet TAKE 1 TABLET BY MOUTH EVERY DAY FOR 3 DAYS FOR SWELLING, THEN AS NEEDED   hydrochlorothiazide (HYDRODIURIL) 25 MG tablet Take 1 tablet (25 mg total) by mouth daily.   hydrOXYzine (ATARAX/VISTARIL) 10 MG tablet TAKE 1 TABLET BY MOUTH THREE TIMES A DAY AS NEEDED   meloxicam (MOBIC) 7.5 MG tablet Take 1 tablet (7.5 mg total) by mouth daily.   mirabegron ER (MYRBETRIQ) 25 MG TB24 tablet Take 1 tablet (25 mg total) by mouth daily.   omeprazole (PRILOSEC) 40 MG capsule Take 1 capsule (40 mg total) by mouth daily.   quinapril (ACCUPRIL) 10 MG tablet Take 1 tablet (10 mg total) by mouth daily.   Selenium 200 MCG CAPS Take by mouth daily.   sitaGLIPtin (JANUVIA) 50 MG tablet Take 1 tablet (50 mg total) by mouth daily.   TURMERIC PO Take 500 mg by mouth daily.   vitamin B-12 (CYANOCOBALAMIN) 500 MCG tablet Take 500 mcg by mouth daily.   vitamin E 100 UNIT capsule Take 100 Units by mouth daily.   [DISCONTINUED] glucose blood (ONETOUCH ULTRA) test strip USE TO TEST BLOOD SUGAR DAILY   [DISCONTINUED] OneTouch Delica Lancets 85I MISC USE  TO TEST BLOOD SUGAR ONCE DAILY   glucose blood (ONETOUCH ULTRA) test strip USE TO TEST BLOOD SUGAR DAILY   OneTouch Delica Lancets 16X MISC USE TO TEST BLOOD SUGAR ONCE DAILY   No facility-administered encounter medications on file as of 02/06/2021.    Surgical History: Past Surgical History:  Procedure Laterality Date   CATARACT EXTRACTION W/PHACO Right 10/21/2018   Procedure: CATARACT EXTRACTION PHACO AND INTRAOCULAR LENS PLACEMENT (IOC) RIGHT VISION BLUE DIABETIC  08.27.0  24.8%  126.0;  Surgeon: Birder Robson, MD;  Location: [place];  Service: Ophthalmology;  Laterality: Right;  diabetic - oral meds   CATARACT EXTRACTION W/PHACO Left 11/18/2018   Procedure: CATARACT  EXTRACTION PHACO AND INTRAOCULAR LENS PLACEMENT (IOC) LEFT DIABETIC 01:18.5     15.8%    12.45;  Surgeon: Birder Robson, MD;  Location: [place];  Service: Ophthalmology;  Laterality: Left;  DIABETIC   TUBAL LIGATION  1966    Medical History: Past Medical History:  Diagnosis Date   A-fib ([place])    Diabetes mellitus, type 2 (HCC)    Gout    feet   Hypertension    MGUS (monoclonal gammopathy of unknown significance)    Osteoporosis    Thyroid nodule     Family History: Family History  Problem Relation Age of Onset   Asthma Sister    Breast cancer Neg Hx     Social History   Socioeconomic History   Marital status: Divorced    Spouse name: Not on file   Number of children: Not on file   Years of education: Not on file   Highest education level: Not on file  Occupational History   Not on file  Tobacco Use   Smoking status: Never   Smokeless tobacco: Never  Vaping Use   Vaping Use: Never used  Substance and Sexual Activity   Alcohol use: Never   Drug use: Never   Sexual activity: Not on file  Other Topics Concern   Not on file  Social History Narrative   Not on file   Social Determinants of Health   Financial Resource Strain: Not on file  Food Insecurity: Not on file  Transportation Needs: Not on file  Physical Activity: Not on file  Stress: Not on file  Social Connections: Not on file  Intimate Partner Violence: Not on file      Review of Systems  Constitutional:  Negative for chills, fatigue and unexpected weight change.  HENT:  Negative for congestion, rhinorrhea, sneezing and sore throat.   Eyes:  Negative for redness.  Respiratory:  Negative for cough, chest tightness and shortness of breath.   Cardiovascular:  Negative for chest pain and palpitations.  Gastrointestinal:  Negative for abdominal pain, constipation, diarrhea, nausea and vomiting.  Genitourinary:  Negative for dysuria and frequency.  Musculoskeletal:  Positive for  arthralgias. Negative for back pain, joint swelling and neck pain.  Skin:  Negative for rash.  Neurological: Negative.  Negative for tremors and numbness.  Hematological:  Negative for adenopathy. Does not bruise/bleed easily.  Psychiatric/Behavioral:  Negative for behavioral problems (Depression), sleep disturbance and suicidal ideas. The patient is not nervous/anxious.    Vital Signs: BP (!) 142/60 Comment: 148/90   Pulse 82    Temp 97.8 F (36.6 C)    Resp 16    Ht 5\' 4"  (1.626 m)    Wt 165 lb (74.8 kg)    SpO2 98%    BMI 28.32 kg/m  Physical Exam Vitals and nursing note reviewed.  Constitutional:      General: She is not in acute distress.    Appearance: Normal appearance. She is normal weight. She is not ill-appearing.  HENT:     Head: Normocephalic and atraumatic.  Eyes:     Extraocular Movements: Extraocular movements intact.     Pupils: Pupils are equal, round, and reactive to light.  Cardiovascular:     Rate and Rhythm: Normal rate and regular rhythm.  Pulmonary:     Effort: Pulmonary effort is normal. No respiratory distress.  Skin:    General: Skin is warm and dry.  Neurological:     Mental Status: She is alert and oriented to person, place, and time.     Cranial Nerves: No cranial nerve deficit.     Coordination: Coordination normal.     Gait: Gait abnormal.  Psychiatric:        Mood and Affect: Mood normal.        Behavior: Behavior normal.       Assessment/Plan: 1. Type 2 diabetes mellitus with stage 3a chronic kidney disease, without long-term current use of insulin (HCC) - POCT HgB A1C is 6.0 which is stable from last check. Continue jardiance and monitor BG at home - glucose blood (ONETOUCH ULTRA) test strip; USE TO TEST BLOOD SUGAR DAILY  Dispense: 100 each; Refill: 3 - OneTouch Delica Lancets 21J MISC; USE TO TEST BLOOD SUGAR ONCE DAILY  Dispense: 100 each; Refill: 3  2. Essential hypertension Borderline elevation, continue current medications and  verify if taking amlodipine and if refill needed. Will start monitoring at home  3. Chronic pain of both knees Will refer to ortho for further management and possible injections - AMB referral to orthopedics  4. MGUS (monoclonal gammopathy of unknown significance) Evaluated by heme/onc and deemed stable, continue to monitor  5. Macrocytosis Evaluated by heme/onc and deemed stable, continue to monitor   General Counseling: laisha rau understanding of the findings of todays visit and agrees with plan of treatment. I have discussed any further diagnostic evaluation that may be needed or ordered today. We also reviewed her medications today. she has been encouraged to call the office with any questions or concerns that should arise related to todays visit.    Orders Placed This Encounter  Procedures   AMB referral to orthopedics   POCT HgB A1C    Meds ordered this encounter  Medications   glucose blood (ONETOUCH ULTRA) test strip    Sig: USE TO TEST BLOOD SUGAR DAILY    Dispense:  100 each    Refill:  3   OneTouch Delica Lancets 94R MISC    Sig: USE TO TEST BLOOD SUGAR ONCE DAILY    Dispense:  100 each    Refill:  3    This patient was seen by Drema Dallas, PA-C in collaboration with Dr. Clayborn Bigness as a part of collaborative care agreement.   Total time spent:30 Minutes Time spent includes review of chart, medications, test results, and follow up plan with the patient.      Dr Lavera Guise Internal medicine

## 2021-02-08 NOTE — Telephone Encounter (Signed)
done

## 2021-02-09 ENCOUNTER — Other Ambulatory Visit: Payer: Self-pay | Admitting: Physician Assistant

## 2021-02-09 ENCOUNTER — Other Ambulatory Visit: Payer: Self-pay

## 2021-02-09 ENCOUNTER — Telehealth: Payer: Self-pay

## 2021-02-09 DIAGNOSIS — D472 Monoclonal gammopathy: Secondary | ICD-10-CM

## 2021-02-09 DIAGNOSIS — D696 Thrombocytopenia, unspecified: Secondary | ICD-10-CM

## 2021-02-09 DIAGNOSIS — I10 Essential (primary) hypertension: Secondary | ICD-10-CM

## 2021-02-09 MED ORDER — QUINAPRIL HCL 10 MG PO TABS: 10.0000 mg | ORAL_TABLET | Freq: Every day | ORAL | 1 refills | Status: DC

## 2021-02-09 MED ORDER — LISINOPRIL 10 MG PO TABS: 10.0000 mg | ORAL_TABLET | Freq: Every day | ORAL | 3 refills | Status: DC

## 2021-02-09 NOTE — Telephone Encounter (Signed)
Pt advised that we changed quinapril to lisinopril and sent rx to [facility] [address]

## 2021-02-09 NOTE — Telephone Encounter (Signed)
Pt called that [facility] don't have quinapril I send refills to [facility] as per pt

## 2021-02-13 ENCOUNTER — Other Ambulatory Visit: Payer: Self-pay | Admitting: Nurse Practitioner

## 2021-02-13 DIAGNOSIS — R600 Localized edema: Secondary | ICD-10-CM

## 2021-02-14 ENCOUNTER — Telehealth: Payer: Self-pay

## 2021-02-14 NOTE — Telephone Encounter (Signed)
Spoke to patient and informed her to stop lisinopril and to take 2- 5 mg amlodipine (total 10 mg) pt advised once she finishes the 5 mg tablets to call us back and we will send in new prescription for 10 mg amlodipine

## 2021-02-14 NOTE — Telephone Encounter (Signed)
error 

## 2021-02-22 ENCOUNTER — Telehealth: Payer: Self-pay

## 2021-02-23 ENCOUNTER — Other Ambulatory Visit: Payer: Self-pay

## 2021-02-23 MED ORDER — LOSARTAN POTASSIUM 25 MG PO TABS: 25.0000 mg | ORAL_TABLET | Freq: Every day | ORAL | 0 refills | Status: DC

## 2021-02-23 NOTE — Telephone Encounter (Signed)
Spoke to pt and informed her that we are adding losartan 25 mg to take with the 10 mg of amlodipine.  I explained to pt to NOT take the extra amlodipine she had added herself to equal 15 mg, told her to only take 10 mg. And to add the 25 mg losartan that I was sending to her pharmacy

## 2021-03-06 ENCOUNTER — Other Ambulatory Visit: Payer: Self-pay | Admitting: Nurse Practitioner

## 2021-03-06 DIAGNOSIS — E1122 Type 2 diabetes mellitus with diabetic chronic kidney disease: Secondary | ICD-10-CM

## 2021-03-06 DIAGNOSIS — N1831 Chronic kidney disease, stage 3a: Secondary | ICD-10-CM

## 2021-03-18 ENCOUNTER — Other Ambulatory Visit: Payer: Self-pay | Admitting: Physician Assistant

## 2021-03-20 ENCOUNTER — Other Ambulatory Visit: Payer: Self-pay

## 2021-03-20 ENCOUNTER — Telehealth: Payer: Self-pay

## 2021-03-20 DIAGNOSIS — K219 Gastro-esophageal reflux disease without esophagitis: Secondary | ICD-10-CM

## 2021-03-20 MED ORDER — OMEPRAZOLE 40 MG PO CPDR: 40.0000 mg | DELAYED_RELEASE_CAPSULE | Freq: Every day | ORAL | 1 refills | Status: DC

## 2021-03-20 NOTE — Telephone Encounter (Signed)
Done

## 2021-03-22 ENCOUNTER — Other Ambulatory Visit: Payer: Self-pay | Admitting: Nurse Practitioner

## 2021-03-22 DIAGNOSIS — M10071 Idiopathic gout, right ankle and foot: Secondary | ICD-10-CM

## 2021-04-04 ENCOUNTER — Other Ambulatory Visit: Payer: Self-pay | Admitting: Physician Assistant

## 2021-04-15 ENCOUNTER — Other Ambulatory Visit: Payer: Self-pay | Admitting: Physician Assistant

## 2021-04-22 ENCOUNTER — Other Ambulatory Visit: Payer: Self-pay | Admitting: Physician Assistant

## 2021-04-22 ENCOUNTER — Other Ambulatory Visit: Payer: Self-pay | Admitting: Nurse Practitioner

## 2021-04-22 DIAGNOSIS — D696 Thrombocytopenia, unspecified: Secondary | ICD-10-CM

## 2021-04-22 DIAGNOSIS — E782 Mixed hyperlipidemia: Secondary | ICD-10-CM

## 2021-04-22 DIAGNOSIS — M10071 Idiopathic gout, right ankle and foot: Secondary | ICD-10-CM

## 2021-04-22 DIAGNOSIS — D472 Monoclonal gammopathy: Secondary | ICD-10-CM

## 2021-05-03 ENCOUNTER — Telehealth: Payer: Self-pay

## 2021-05-03 ENCOUNTER — Other Ambulatory Visit: Payer: Self-pay

## 2021-05-04 NOTE — Telephone Encounter (Signed)
Pt advised that she can hold amlodipine for now and monitor BP and bring BP log on next visit  ?

## 2021-05-08 ENCOUNTER — Ambulatory Visit: Payer: Medicare Other | Admitting: Physician Assistant

## 2021-05-11 ENCOUNTER — Ambulatory Visit: Payer: Medicare Other | Admitting: Physician Assistant

## 2021-05-18 ENCOUNTER — Ambulatory Visit: Payer: Medicare Other | Admitting: Physician Assistant

## 2021-05-31 ENCOUNTER — Encounter: Payer: Self-pay | Admitting: Nurse Practitioner

## 2021-06-06 ENCOUNTER — Other Ambulatory Visit: Payer: Self-pay | Admitting: Physician Assistant

## 2021-06-06 DIAGNOSIS — N1831 Chronic kidney disease, stage 3a: Secondary | ICD-10-CM

## 2021-06-20 ENCOUNTER — Other Ambulatory Visit: Payer: Self-pay | Admitting: Physician Assistant

## 2021-06-20 DIAGNOSIS — M10071 Idiopathic gout, right ankle and foot: Secondary | ICD-10-CM

## 2021-06-26 ENCOUNTER — Ambulatory Visit (INDEPENDENT_AMBULATORY_CARE_PROVIDER_SITE_OTHER): Payer: Medicare Other | Admitting: Physician Assistant

## 2021-06-26 ENCOUNTER — Ambulatory Visit: Payer: Medicare Other | Admitting: Physician Assistant

## 2021-06-26 ENCOUNTER — Other Ambulatory Visit: Payer: Self-pay | Admitting: Physician Assistant

## 2021-06-26 ENCOUNTER — Encounter: Payer: Self-pay | Admitting: Physician Assistant

## 2021-06-26 VITALS — BP 140/70 | HR 93 | Temp 97.8°F | Resp 16 | Ht 64.0 in | Wt 166.0 lb

## 2021-06-26 DIAGNOSIS — M25562 Pain in left knee: Secondary | ICD-10-CM

## 2021-06-26 DIAGNOSIS — Z0001 Encounter for general adult medical examination with abnormal findings: Secondary | ICD-10-CM

## 2021-06-26 DIAGNOSIS — Z23 Encounter for immunization: Secondary | ICD-10-CM

## 2021-06-26 DIAGNOSIS — M064 Inflammatory polyarthropathy: Secondary | ICD-10-CM | POA: Diagnosis not present

## 2021-06-26 DIAGNOSIS — R300 Dysuria: Secondary | ICD-10-CM

## 2021-06-26 DIAGNOSIS — R0989 Other specified symptoms and signs involving the circulatory and respiratory systems: Secondary | ICD-10-CM

## 2021-06-26 DIAGNOSIS — N1831 Chronic kidney disease, stage 3a: Secondary | ICD-10-CM

## 2021-06-26 DIAGNOSIS — I10 Essential (primary) hypertension: Secondary | ICD-10-CM | POA: Diagnosis not present

## 2021-06-26 DIAGNOSIS — E1122 Type 2 diabetes mellitus with diabetic chronic kidney disease: Secondary | ICD-10-CM | POA: Diagnosis not present

## 2021-06-26 DIAGNOSIS — M25561 Pain in right knee: Secondary | ICD-10-CM

## 2021-06-26 DIAGNOSIS — G8929 Other chronic pain: Secondary | ICD-10-CM

## 2021-06-26 LAB — POCT GLYCOSYLATED HEMOGLOBIN (HGB A1C): Hemoglobin A1C: 5.5 % (ref 4.0–5.6)

## 2021-06-26 MED ORDER — SITAGLIPTIN PHOSPHATE 25 MG PO TABS: 25.0000 mg | ORAL_TABLET | Freq: Every day | ORAL | 2 refills | Status: DC

## 2021-06-26 MED ORDER — PNEUMOCOCCAL 20-VAL CONJ VACC 0.5 ML IM SUSY
0.5000 mL | PREFILLED_SYRINGE | INTRAMUSCULAR | 0 refills | Status: AC
Start: 2021-06-26 — End: 2021-06-26

## 2021-06-26 MED ORDER — TETANUS-DIPHTH-ACELL PERTUSSIS 5-2.5-18.5 LF-MCG/0.5 IM SUSY: 0.5000 mL | PREFILLED_SYRINGE | Freq: Once | INTRAMUSCULAR | 0 refills | Status: AC

## 2021-06-26 NOTE — Progress Notes (Signed)
[facility] [place], [place] [address]  Internal MEDICINE  Office Visit Note  Patient Name: Tracey Love  [id number]  [id number]  Date of Service: 06/26/2021  Chief Complaint  Patient presents with   Medicare Wellness   Hypertension   Diabetes     HPI Pt is here for routine health maintenance examination -reports that the amplodipine she received this past time didn't agree with her. States the pill looked different and may have changed manufacturer -BG 102-150, and A1c significantly improved to normal range. Will decrease to '25mg'$  januvia -BP at home since off amlodipine: 120-150s/80-90. Fluctuates a lot. Will try doubling losartan to '50mg'$  and continuing '25mg'$  HCTZ -takes BP meds in AM -fingers are sore and stiff, takes meloxicam and tylenol as needed -Saw ortho for her knees -due for PNA and tdap vaccines -Foot check done with sensation preserved  Current Medication: Outpatient Encounter Medications as of 06/26/2021  Medication Sig   acetaminophen (TYLENOL) 500 MG tablet Take 1,000 mg by mouth every 8 (eight) hours as needed for mild pain or moderate pain.    aspirin EC 81 MG tablet Take 81 mg by mouth daily.   atorvastatin (LIPITOR) 40 MG tablet TAKE 1 TABLET BY MOUTH EVERY DAY   Cholecalciferol (VITAMIN D-3) 1000 UNITS CAPS Take 1,000 Units by mouth daily.   colchicine 0.6 MG tablet TAKE 2 TABSS ON THE FIRST DAY OF GOUT FLARE, TAKE 1 ADDITIONAL TAB 1 HR AFTER ON THE FIRST DAY. AFTER THE FIRST DAY TAKE 1 TABLET DAILY UNTIL GOUT FLARE RESOLVES. USE AS NEEDED FOR GOUT FLARES.   CRANBERRY PO Take 4,200 mg by mouth daily as needed.   ELIQUIS 5 MG TABS tablet TAKE 1 TABLET BY MOUTH TWICE A DAY   furosemide (LASIX) 20 MG tablet TAKE 1 TABLET BY MOUTH EVERY DAY FOR 3 DAYS FOR SWELLING, THEN AS NEEDED   glucose blood (ONETOUCH ULTRA) test strip USE TO TEST BLOOD SUGAR DAILY   hydrochlorothiazide (HYDRODIURIL) 25 MG tablet TAKE 1 TABLET BY MOUTH EVERY DAY    hydrOXYzine (ATARAX/VISTARIL) 10 MG tablet TAKE 1 TABLET BY MOUTH THREE TIMES A DAY AS NEEDED   losartan (COZAAR) 25 MG tablet TAKE 1 TABLET (25 MG TOTAL) BY MOUTH DAILY.   meloxicam (MOBIC) 7.5 MG tablet TAKE 1 TABLET BY MOUTH EVERY DAY   MYRBETRIQ 25 MG TB24 tablet TAKE 1 TABLET BY MOUTH EVERY DAY   omeprazole (PRILOSEC) 40 MG capsule Take 1 capsule (40 mg total) by mouth daily.   OneTouch Delica Lancets 54Y MISC USE TO TEST BLOOD SUGAR ONCE DAILY   Selenium 200 MCG CAPS Take by mouth daily.   sitaGLIPtin (JANUVIA) 25 MG tablet Take 1 tablet (25 mg total) by mouth daily.   TURMERIC PO Take 500 mg by mouth daily.   vitamin B-12 (CYANOCOBALAMIN) 500 MCG tablet Take 500 mcg by mouth daily.   vitamin E 100 UNIT capsule Take 100 Units by mouth daily.   [DISCONTINUED] amLODipine (NORVASC) 5 MG tablet Take 1 tablet (5 mg total) by mouth daily. (Patient taking differently: Take 10 mg by mouth daily. Take 2 tablets once daily (total 10 mg))   [DISCONTINUED] JANUVIA 50 MG tablet TAKE 1 TABLET BY MOUTH EVERY DAY   [DISCONTINUED] pneumococcal 20-valent conjugate vaccine (PREVNAR 20) 0.5 ML injection Inject 0.5 mLs into the muscle tomorrow at 10 am.   [DISCONTINUED] quinapril (ACCUPRIL) 10 MG tablet TAKE 1 TABLET BY MOUTH EVERY DAY   [DISCONTINUED] Tdap (BOOSTRIX) 5-2.5-18.5 LF-MCG/0.5  injection Inject 0.5 mLs into the muscle once.   pneumococcal 20-valent conjugate vaccine (PREVNAR 20) 0.5 ML injection Inject 0.5 mLs into the muscle tomorrow at 10 am for 1 dose.   Tdap (BOOSTRIX) 5-2.5-18.5 LF-MCG/0.5 injection Inject 0.5 mLs into the muscle once for 1 dose.   No facility-administered encounter medications on file as of 06/26/2021.    Surgical History: Past Surgical History:  Procedure Laterality Date   CATARACT EXTRACTION W/PHACO Right 10/21/2018   Procedure: CATARACT EXTRACTION PHACO AND INTRAOCULAR LENS PLACEMENT (IOC) RIGHT VISION BLUE DIABETIC  08.27.0  24.8%  126.0;  Surgeon: Birder Robson, MD;   Location: [place];  Service: Ophthalmology;  Laterality: Right;  diabetic - oral meds   CATARACT EXTRACTION W/PHACO Left 11/18/2018   Procedure: CATARACT EXTRACTION PHACO AND INTRAOCULAR LENS PLACEMENT (IOC) LEFT DIABETIC 01:18.5     15.8%    12.45;  Surgeon: Birder Robson, MD;  Location: [place];  Service: Ophthalmology;  Laterality: Left;  DIABETIC   TUBAL LIGATION  1966    Medical History: Past Medical History:  Diagnosis Date   A-fib ([place])    Diabetes mellitus, type 2 ([place])    Gout    feet   Hypertension    MGUS (monoclonal gammopathy of unknown significance)    Osteoporosis    Thyroid nodule     Family History: Family History  Problem Relation Age of Onset   Asthma Sister    Breast cancer Neg Hx       Review of Systems  Constitutional:  Negative for chills, fatigue and unexpected weight change.  HENT:  Negative for congestion, rhinorrhea, sneezing and sore throat.   Eyes:  Negative for redness.  Respiratory:  Negative for cough, chest tightness and shortness of breath.   Cardiovascular:  Negative for chest pain and palpitations.  Gastrointestinal:  Negative for abdominal pain, constipation, diarrhea, nausea and vomiting.  Genitourinary:  Negative for dysuria and frequency.  Musculoskeletal:  Positive for arthralgias. Negative for back pain, joint swelling and neck pain.  Skin:  Negative for rash.  Neurological: Negative.  Negative for tremors and numbness.  Hematological:  Negative for adenopathy. Does not bruise/bleed easily.  Psychiatric/Behavioral:  Negative for behavioral problems (Depression), sleep disturbance and suicidal ideas. The patient is not nervous/anxious.     Vital Signs: BP 140/70 Comment: 160/90  Pulse 93   Temp 97.8 F (36.6 C)   Resp 16   Ht '5\' 4"'$  (1.626 m)   Wt 166 lb (75.3 kg)   SpO2 99%   BMI 28.49 kg/m    Physical Exam Vitals and nursing note reviewed.  Constitutional:      General: She is not in acute  distress.    Appearance: Normal appearance. She is normal weight. She is not ill-appearing.  HENT:     Head: Normocephalic and atraumatic.  Eyes:     Extraocular Movements: Extraocular movements intact.     Pupils: Pupils are equal, round, and reactive to light.  Cardiovascular:     Rate and Rhythm: Normal rate and regular rhythm.     Pulses:          Dorsalis pedis pulses are 3+ on the right side and 3+ on the left side.       Posterior tibial pulses are 3+ on the right side and 3+ on the left side.  Pulmonary:     Effort: Pulmonary effort is normal. No respiratory distress.  Abdominal:     General: Abdomen is flat.  Tenderness: There is no abdominal tenderness.  Musculoskeletal:     Right foot: Normal range of motion.     Left foot: Normal range of motion.  Feet:     Right foot:     Protective Sensation: 2 sites tested.  2 sites sensed.     Skin integrity: Skin integrity normal.     Toenail Condition: Right toenails are long.     Left foot:     Protective Sensation: 2 sites tested.  2 sites sensed.     Skin integrity: Skin integrity normal.     Toenail Condition: Left toenails are long.  Skin:    General: Skin is warm and dry.  Neurological:     Mental Status: She is alert and oriented to person, place, and time.     Cranial Nerves: No cranial nerve deficit.     Coordination: Coordination normal.     Gait: Gait abnormal.  Psychiatric:        Mood and Affect: Mood normal.        Behavior: Behavior normal.     LABS: Recent Results (from the past 2160 hour(s))  POCT HgB A1C     Status: None   Collection Time: 06/26/21  9:38 AM  Result Value Ref Range   Hemoglobin A1C 5.5 4.0 - 5.6 %   HbA1c POC (<> result, manual entry)     HbA1c, POC (prediabetic range)     HbA1c, POC (controlled diabetic range)          Assessment/Plan: 1. Encounter for general adult medical examination with abnormal findings CPE performed, due for pneumonia and Tdap vaccines otherwise  up-to-date on preventive health maintenance  2. Essential hypertension In office and has been elevated on home recordings.  We will go ahead and double to 50 mg of losartan and continue 25 mg HCTZ while discontinuing amlodipine due to side effects  3. Type 2 diabetes mellitus with stage 3a chronic kidney disease, without long-term current use of insulin (HCC) - POCT HgB A1C is 5.5 which significantly improved from 6.0 at last check range.  We will go ahead and decrease to 25 mg Januvia and continue to monitor - sitaGLIPtin (JANUVIA) 25 MG tablet; Take 1 tablet (25 mg total) by mouth daily.  Dispense: 30 tablet; Refill: 2  4. Bilateral carotid bruits We will check carotid ultrasound - US Carotid Duplex Bilateral; Future  5. Inflammatory polyarthropathy of multiple sites [facility]) Encouraged to only use meloxicam as needed as she is on Eliquis.  May alternate with Tylenol as needed  6. Chronic pain of both knees Followed by Ortho  7. Need for pneumococcal vaccine - pneumococcal 20-valent conjugate vaccine (PREVNAR 20) 0.5 ML injection; Inject 0.5 mLs into the muscle tomorrow at 10 am for 1 dose.  Dispense: 0.5 mL; Refill: 0  8. Need for Tdap vaccination - Tdap ([place]) 5-2.5-18.5 LF-MCG/0.5 injection; Inject 0.5 mLs into the muscle once for 1 dose.  Dispense: 0.5 mL; Refill: 0  9. Dysuria - UA/M w/rflx Culture, Routine   General Counseling: Riyanna verbalizes understanding of the findings of todays visit and agrees with plan of treatment. I have discussed any further diagnostic evaluation that may be needed or ordered today. We also reviewed her medications today. she has been encouraged to call the office with any questions or concerns that should arise related to todays visit.    Counseling:    Orders Placed This Encounter  Procedures   US Carotid Duplex Bilateral   UA/M w/rflx  Culture, Routine   POCT HgB A1C    Meds ordered this encounter  Medications   Tdap (BOOSTRIX)  5-2.5-18.5 LF-MCG/0.5 injection    Sig: Inject 0.5 mLs into the muscle once for 1 dose.    Dispense:  0.5 mL    Refill:  0   pneumococcal 20-valent conjugate vaccine (PREVNAR 20) 0.5 ML injection    Sig: Inject 0.5 mLs into the muscle tomorrow at 10 am for 1 dose.    Dispense:  0.5 mL    Refill:  0   sitaGLIPtin (JANUVIA) 25 MG tablet    Sig: Take 1 tablet (25 mg total) by mouth daily.    Dispense:  30 tablet    Refill:  2    This patient was seen by Drema Dallas, PA-C in collaboration with Dr. Clayborn Bigness as a part of collaborative care agreement.  Total time spent:40 Minutes  Time spent includes review of chart, medications, test results, and follow up plan with the patient.     Lavera Guise, MD  Internal Medicine

## 2021-06-29 LAB — UA/M W/RFLX CULTURE, ROUTINE
Bilirubin, UA: NEGATIVE
Glucose, UA: NEGATIVE
Ketones, UA: NEGATIVE
Nitrite, UA: POSITIVE — AB
Protein,UA: NEGATIVE
RBC, UA: NEGATIVE
Specific Gravity, UA: 1.012 (ref 1.005–1.030)
Urobilinogen, Ur: 1 mg/dL (ref 0.2–1.0)
pH, UA: 6 (ref 5.0–7.5)

## 2021-06-29 LAB — MICROSCOPIC EXAMINATION
Bacteria, UA: NONE SEEN
Casts: NONE SEEN /lpf
Epithelial Cells (non renal): NONE SEEN /hpf (ref 0–10)
RBC, Urine: NONE SEEN /hpf (ref 0–2)
WBC, UA: NONE SEEN /hpf (ref 0–5)

## 2021-06-29 LAB — URINE CULTURE, REFLEX

## 2021-07-15 ENCOUNTER — Other Ambulatory Visit: Payer: Self-pay | Admitting: Nurse Practitioner

## 2021-07-15 ENCOUNTER — Other Ambulatory Visit: Payer: Self-pay | Admitting: Physician Assistant

## 2021-07-15 ENCOUNTER — Other Ambulatory Visit: Payer: Self-pay | Admitting: Internal Medicine

## 2021-07-15 DIAGNOSIS — R600 Localized edema: Secondary | ICD-10-CM

## 2021-07-16 ENCOUNTER — Other Ambulatory Visit: Payer: Self-pay | Admitting: Physician Assistant

## 2021-07-24 ENCOUNTER — Other Ambulatory Visit: Payer: Medicare Other

## 2021-08-02 ENCOUNTER — Telehealth: Payer: Self-pay

## 2021-08-02 NOTE — Telephone Encounter (Signed)
Attempted to contact to confirm 08/07/21 appointment. No answer, no vm-toni

## 2021-08-04 ENCOUNTER — Ambulatory Visit: Payer: Medicare Other | Admitting: Physician Assistant

## 2021-08-07 ENCOUNTER — Ambulatory Visit (INDEPENDENT_AMBULATORY_CARE_PROVIDER_SITE_OTHER): Payer: Medicare Other

## 2021-08-07 DIAGNOSIS — R0989 Other specified symptoms and signs involving the circulatory and respiratory systems: Secondary | ICD-10-CM | POA: Diagnosis not present

## 2021-08-24 ENCOUNTER — Ambulatory Visit: Payer: Medicare Other | Admitting: Physician Assistant

## 2021-08-28 NOTE — Procedures (Signed)
[place], [place] [address]  DATE OF SERVICE: 08/07/2021  CAROTID DOPPLER INTERPRETATION:  Bilateral Carotid Ultrsasound and Color Doppler Examination was performed. The RIGHT CCA shows no significant plaque in the vessel. The LEFT CCA shows no significant plaque in the vessel. There was no significant intimal thickening noted in the RIGHT carotid artery. There was no significant intimal thickening in the LEFT carotid artery.  The RIGHT CCA shows peak systolic velocity of 81 cm per second. The end diastolic velocity is 17 cm per second on the RIGHT side. The RIGHT ICA shows peak systolic velocity of 646 per second. RIGHT sided ICA end diastolic velocity is 25 cm per second. The RIGHT ECA shows a peak systolic velocity of 32 cm per second. The ICA/CCA ratio is calculated to be 1.3. This suggests less than 50% stenosis. The Vertebral Artery shows antegrade flow.  The LEFT CCA shows peak systolic velocity of 83 cm per second. The end diastolic velocity is 19 cm per second on the LEFT side. The LEFT ICA shows peak systolic velocity of 98 per second. LEFT sided ICA end diastolic velocity is 15 cm per second. The LEFT ECA shows a peak systolic velocity of 54 cm per second. The ICA/CCA ratio is calculated to be 0.8. This suggests less than 50% stenosis. The Vertebral Artery shows antegrade flow.   Impression:    The RIGHT CAROTID shows less than 50% stenosis. The LEFT CAROTID shows less than 50% stenosis.  There is no significant plaque formation noted on the LEFT and no significant plaque on the RIGHT  side. Consider a repeat Carotid doppler if clinical situation and symptoms warrant in 6-12 months. Patient should be encouraged to change lifestyles such as smoking cessation, regular exercise and dietary modification. Use of statins in the right clinical setting and ASA is encouraged.  Allyne Gee, MD [facility] Pulmonary Critical Care Medicine

## 2021-09-15 ENCOUNTER — Encounter: Payer: Self-pay | Admitting: Physician Assistant

## 2021-09-15 ENCOUNTER — Ambulatory Visit (INDEPENDENT_AMBULATORY_CARE_PROVIDER_SITE_OTHER): Payer: Medicare Other | Admitting: Physician Assistant

## 2021-09-15 VITALS — BP 114/72 | HR 85 | Temp 97.8°F | Resp 16 | Ht 64.0 in | Wt 164.8 lb

## 2021-09-15 DIAGNOSIS — I10 Essential (primary) hypertension: Secondary | ICD-10-CM

## 2021-09-15 DIAGNOSIS — N1831 Chronic kidney disease, stage 3a: Secondary | ICD-10-CM | POA: Diagnosis not present

## 2021-09-15 DIAGNOSIS — L299 Pruritus, unspecified: Secondary | ICD-10-CM

## 2021-09-15 DIAGNOSIS — E1122 Type 2 diabetes mellitus with diabetic chronic kidney disease: Secondary | ICD-10-CM

## 2021-09-15 MED ORDER — HYDROXYZINE HCL 10 MG PO TABS: 10.0000 mg | ORAL_TABLET | Freq: Three times a day (TID) | ORAL | 1 refills | Status: DC | PRN

## 2021-09-15 NOTE — Progress Notes (Signed)
[facility] [place], [place] [address]  Internal MEDICINE  Office Visit Note  Patient Name: Tracey Love  [id number]  [id number]  Date of Service: 09/15/2021  Chief Complaint  Patient presents with   Follow-up   Diabetes   Hypertension   Quality Metric Gaps    Pneumonia and Tetanus Vaccines    HPI Pt is here for routine follow up -BG this morning was 118.  She is continuing to do well after lowering Januvia dose last visit after A1c had normalized.  We will need to repeat A1c next visit -Carotid US showed less than 50% stenosis and no significant plaque bilaterally. Pt will continue lipitor as before -Not checking BP at home now. Improved significantly in office and states she was anxious about her carotid test results and is reassured now. -Does need hydroxyzine refilled -thinks she had PNA and tdap done and will call pharmacy to confirm and gets dates and then will call office so these can be added to her chart  Current Medication: Outpatient Encounter Medications as of 09/15/2021  Medication Sig   acetaminophen (TYLENOL) 500 MG tablet Take 1,000 mg by mouth every 8 (eight) hours as needed for mild pain or moderate pain.    aspirin EC 81 MG tablet Take 81 mg by mouth daily.   atorvastatin (LIPITOR) 40 MG tablet TAKE 1 TABLET BY MOUTH EVERY DAY   Cholecalciferol (VITAMIN D-3) 1000 UNITS CAPS Take 1,000 Units by mouth daily.   colchicine 0.6 MG tablet TAKE 2 TABSS ON THE FIRST DAY OF GOUT FLARE, TAKE 1 ADDITIONAL TAB 1 HR AFTER ON THE FIRST DAY. AFTER THE FIRST DAY TAKE 1 TABLET DAILY UNTIL GOUT FLARE RESOLVES. USE AS NEEDED FOR GOUT FLARES.   CRANBERRY PO Take 4,200 mg by mouth daily as needed.   ELIQUIS 5 MG TABS tablet TAKE 1 TABLET BY MOUTH TWICE A DAY   furosemide (LASIX) 20 MG tablet TAKE 1 TABLET BY MOUTH EVERY DAY FOR 3 DAYS FOR SWELLING, THEN AS NEEDED   glucose blood (ONETOUCH ULTRA) test strip USE TO TEST BLOOD SUGAR DAILY    hydrochlorothiazide (HYDRODIURIL) 25 MG tablet TAKE 1 TABLET BY MOUTH EVERY DAY   losartan (COZAAR) 25 MG tablet TAKE 1 TABLET (25 MG TOTAL) BY MOUTH DAILY.   meloxicam (MOBIC) 7.5 MG tablet TAKE 1 TABLET BY MOUTH EVERY DAY   MYRBETRIQ 25 MG TB24 tablet TAKE 1 TABLET BY MOUTH EVERY DAY   omeprazole (PRILOSEC) 40 MG capsule Take 1 capsule (40 mg total) by mouth daily.   OneTouch Delica Lancets 90W MISC USE TO TEST BLOOD SUGAR ONCE DAILY   Selenium 200 MCG CAPS Take by mouth daily.   sitaGLIPtin (JANUVIA) 25 MG tablet Take 1 tablet (25 mg total) by mouth daily.   TURMERIC PO Take 500 mg by mouth daily.   vitamin B-12 (CYANOCOBALAMIN) 500 MCG tablet Take 500 mcg by mouth daily.   vitamin E 100 UNIT capsule Take 100 Units by mouth daily.   [DISCONTINUED] hydrOXYzine (ATARAX/VISTARIL) 10 MG tablet TAKE 1 TABLET BY MOUTH THREE TIMES A DAY AS NEEDED   hydrOXYzine (ATARAX) 10 MG tablet Take 1 tablet (10 mg total) by mouth 3 (three) times daily as needed.   No facility-administered encounter medications on file as of 09/15/2021.    Surgical History: Past Surgical History:  Procedure Laterality Date   CATARACT EXTRACTION W/PHACO Right 10/21/2018   Procedure: CATARACT EXTRACTION PHACO AND INTRAOCULAR LENS PLACEMENT (IOC) RIGHT VISION BLUE DIABETIC  08.27.0  24.8%  126.0;  Surgeon: Birder Robson, MD;  Location: [facility];  Service: Ophthalmology;  Laterality: Right;  diabetic - oral meds   CATARACT EXTRACTION W/PHACO Left 11/18/2018   Procedure: CATARACT EXTRACTION PHACO AND INTRAOCULAR LENS PLACEMENT (IOC) LEFT DIABETIC 01:18.5     15.8%    12.45;  Surgeon: Birder Robson, MD;  Location: [facility];  Service: Ophthalmology;  Laterality: Left;  DIABETIC   TUBAL LIGATION  1966    Medical History: Past Medical History:  Diagnosis Date   A-fib ([place])    Diabetes mellitus, type 2 (HCC)    Gout    feet   Hypertension    MGUS (monoclonal gammopathy of unknown significance)     Osteoporosis    Thyroid nodule     Family History: Family History  Problem Relation Age of Onset   Asthma Sister    Breast cancer Neg Hx     Social History   Socioeconomic History   Marital status: Divorced    Spouse name: Not on file   Number of children: Not on file   Years of education: Not on file   Highest education level: Not on file  Occupational History   Not on file  Tobacco Use   Smoking status: Never   Smokeless tobacco: Never  Vaping Use   Vaping Use: Never used  Substance and Sexual Activity   Alcohol use: Never   Drug use: Never   Sexual activity: Not on file  Other Topics Concern   Not on file  Social History Narrative   Not on file   Social Determinants of Health   Financial Resource Strain: Not on file  Food Insecurity: Not on file  Transportation Needs: Not on file  Physical Activity: Not on file  Stress: Not on file  Social Connections: Not on file  Intimate Partner Violence: Not on file      Review of Systems  Constitutional:  Negative for chills, fatigue and unexpected weight change.  HENT:  Negative for congestion, rhinorrhea, sneezing and sore throat.   Eyes:  Negative for redness.  Respiratory:  Negative for cough, chest tightness and shortness of breath.   Cardiovascular:  Negative for chest pain and palpitations.  Gastrointestinal:  Negative for abdominal pain, constipation, diarrhea, nausea and vomiting.  Genitourinary:  Negative for dysuria and frequency.  Musculoskeletal:  Positive for arthralgias. Negative for back pain, joint swelling and neck pain.  Skin:  Negative for rash.  Neurological: Negative.  Negative for tremors and numbness.  Hematological:  Negative for adenopathy. Does not bruise/bleed easily.  Psychiatric/Behavioral:  Negative for behavioral problems (Depression), sleep disturbance and suicidal ideas. The patient is not nervous/anxious.     Vital Signs: BP 114/72 Comment: 139/93  Pulse 85   Temp 97.8 F  (36.6 C)   Resp 16   Ht '5\' 4"'$  (1.626 m)   Wt 164 lb 12.8 oz (74.8 kg)   SpO2 97%   BMI 28.29 kg/m    Physical Exam Vitals and nursing note reviewed.  Constitutional:      General: She is not in acute distress.    Appearance: Normal appearance. She is normal weight. She is not ill-appearing.  HENT:     Head: Normocephalic and atraumatic.  Eyes:     Extraocular Movements: Extraocular movements intact.     Pupils: Pupils are equal, round, and reactive to light.  Cardiovascular:     Rate and Rhythm: Normal rate and regular rhythm.  Pulmonary:  Effort: Pulmonary effort is normal. No respiratory distress.  Skin:    General: Skin is warm and dry.  Neurological:     Mental Status: She is alert and oriented to person, place, and time.     Cranial Nerves: No cranial nerve deficit.     Coordination: Coordination normal.     Gait: Gait abnormal.  Psychiatric:        Mood and Affect: Mood normal.        Behavior: Behavior normal.        Assessment/Plan: 1. Essential hypertension Stable, continue current medication  2. Type 2 diabetes mellitus with stage 3a chronic kidney disease, without long-term current use of insulin ([place]) Too early to recheck A1c, especially since in normal range last check. Will recheck next visit. Continue lower dose of januvia  3. Itching - hydrOXYzine (ATARAX) 10 MG tablet; Take 1 tablet (10 mg total) by mouth 3 (three) times daily as needed.  Dispense: 270 tablet; Refill: 1   General Counseling: Katarina verbalizes understanding of the findings of todays visit and agrees with plan of treatment. I have discussed any further diagnostic evaluation that may be needed or ordered today. We also reviewed her medications today. she has been encouraged to call the office with any questions or concerns that should arise related to todays visit.    No orders of the defined types were placed in this encounter.   Meds ordered this encounter  Medications    hydrOXYzine (ATARAX) 10 MG tablet    Sig: Take 1 tablet (10 mg total) by mouth 3 (three) times daily as needed.    Dispense:  270 tablet    Refill:  1    This patient was seen by Drema Dallas, PA-C in collaboration with Dr. Clayborn Bigness as a part of collaborative care agreement.   Total time spent:30 Minutes Time spent includes review of chart, medications, test results, and follow up plan with the patient.      Dr Lavera Guise Internal medicine

## 2021-09-18 ENCOUNTER — Other Ambulatory Visit: Payer: Self-pay | Admitting: Physician Assistant

## 2021-09-18 DIAGNOSIS — K219 Gastro-esophageal reflux disease without esophagitis: Secondary | ICD-10-CM

## 2021-10-06 ENCOUNTER — Other Ambulatory Visit: Payer: Self-pay | Admitting: Internal Medicine

## 2021-10-06 ENCOUNTER — Other Ambulatory Visit: Payer: Self-pay | Admitting: Physician Assistant

## 2021-10-06 DIAGNOSIS — N1831 Chronic kidney disease, stage 3a: Secondary | ICD-10-CM

## 2021-10-06 NOTE — Telephone Encounter (Signed)
Please do PA

## 2021-10-09 ENCOUNTER — Other Ambulatory Visit: Payer: Self-pay

## 2021-10-09 DIAGNOSIS — N1831 Chronic kidney disease, stage 3a: Secondary | ICD-10-CM

## 2021-10-09 MED ORDER — SITAGLIPTIN PHOSPHATE 25 MG PO TABS: 25.0000 mg | ORAL_TABLET | Freq: Every day | ORAL | 0 refills | Status: DC

## 2021-10-09 NOTE — Telephone Encounter (Signed)
PA completed and came back stating that the product is on your list of covered drugs.  Prior Auth is not required at this time

## 2021-10-10 ENCOUNTER — Other Ambulatory Visit: Payer: Self-pay | Admitting: Physician Assistant

## 2021-10-10 DIAGNOSIS — D696 Thrombocytopenia, unspecified: Secondary | ICD-10-CM

## 2021-10-10 DIAGNOSIS — D472 Monoclonal gammopathy: Secondary | ICD-10-CM

## 2021-11-24 ENCOUNTER — Other Ambulatory Visit: Payer: Self-pay | Admitting: Physician Assistant

## 2021-11-24 DIAGNOSIS — E782 Mixed hyperlipidemia: Secondary | ICD-10-CM

## 2021-12-08 ENCOUNTER — Other Ambulatory Visit: Payer: Self-pay | Admitting: Physician Assistant

## 2021-12-08 DIAGNOSIS — E1122 Type 2 diabetes mellitus with diabetic chronic kidney disease: Secondary | ICD-10-CM

## 2021-12-08 MED ORDER — SITAGLIPTIN PHOSPHATE 25 MG PO TABS: 25.0000 mg | ORAL_TABLET | Freq: Every day | ORAL | 0 refills | Status: DC

## 2021-12-08 MED ORDER — LOSARTAN POTASSIUM 25 MG PO TABS: 25.0000 mg | ORAL_TABLET | Freq: Every day | ORAL | 1 refills | Status: DC

## 2021-12-11 ENCOUNTER — Ambulatory Visit: Payer: Medicare Other | Admitting: Physician Assistant

## 2021-12-12 ENCOUNTER — Other Ambulatory Visit: Payer: Self-pay | Admitting: Nurse Practitioner

## 2021-12-12 DIAGNOSIS — R600 Localized edema: Secondary | ICD-10-CM

## 2021-12-14 ENCOUNTER — Encounter: Payer: Self-pay | Admitting: Physician Assistant

## 2021-12-14 ENCOUNTER — Ambulatory Visit (INDEPENDENT_AMBULATORY_CARE_PROVIDER_SITE_OTHER): Payer: Medicare Other | Admitting: Physician Assistant

## 2021-12-14 VITALS — BP 130/78 | HR 56 | Temp 97.6°F | Resp 16 | Ht 64.0 in | Wt 175.2 lb

## 2021-12-14 DIAGNOSIS — N1831 Chronic kidney disease, stage 3a: Secondary | ICD-10-CM

## 2021-12-14 DIAGNOSIS — E041 Nontoxic single thyroid nodule: Secondary | ICD-10-CM | POA: Diagnosis not present

## 2021-12-14 DIAGNOSIS — E559 Vitamin D deficiency, unspecified: Secondary | ICD-10-CM

## 2021-12-14 DIAGNOSIS — E782 Mixed hyperlipidemia: Secondary | ICD-10-CM

## 2021-12-14 DIAGNOSIS — I10 Essential (primary) hypertension: Secondary | ICD-10-CM

## 2021-12-14 DIAGNOSIS — E1122 Type 2 diabetes mellitus with diabetic chronic kidney disease: Secondary | ICD-10-CM | POA: Diagnosis not present

## 2021-12-14 DIAGNOSIS — R5383 Other fatigue: Secondary | ICD-10-CM

## 2021-12-14 LAB — POCT GLYCOSYLATED HEMOGLOBIN (HGB A1C): Hemoglobin A1C: 6.2 % — AB (ref 4.0–5.6)

## 2021-12-14 NOTE — Progress Notes (Signed)
[facility] [place], [place] [address]  Internal MEDICINE  Office Visit Note  Patient Name: Tracey Love  [id number]  [id number]  Date of Service: 12/14/2021  Chief Complaint  Patient presents with   Follow-up   Diabetes   Hypertension   Medication Refill    Patient needs Januvia     HPI Pt is here for routine follow up -Refill for januvia already sent and she will call to fill this. Has been out for a few days. -Tolerating BP meds well, doesn't usually check her BP at home, HR a little low today, but pt denies any dizziness or lightheadedness. Will need to monitor -Due for routine fasting labs -Chart review shows previous abnormal thyroid US by outside facility many years ago and had recommended biopsy. Pt states she declined the biopsy at the time as she didn't want anyone cutting on her. She is willing to update an [place] and would be open to seeing ENT for biopsy if absolutely needed  Current Medication: Outpatient Encounter Medications as of 12/14/2021  Medication Sig   acetaminophen (TYLENOL) 500 MG tablet Take 1,000 mg by mouth every 8 (eight) hours as needed for mild pain or moderate pain.    aspirin EC 81 MG tablet Take 81 mg by mouth daily.   atorvastatin (LIPITOR) 40 MG tablet TAKE 1 TABLET BY MOUTH EVERY DAY   Cholecalciferol (VITAMIN D-3) 1000 UNITS CAPS Take 1,000 Units by mouth daily.   colchicine 0.6 MG tablet TAKE 2 TABSS ON THE FIRST DAY OF GOUT FLARE, TAKE 1 ADDITIONAL TAB 1 HR AFTER ON THE FIRST DAY. AFTER THE FIRST DAY TAKE 1 TABLET DAILY UNTIL GOUT FLARE RESOLVES. USE AS NEEDED FOR GOUT FLARES.   CRANBERRY PO Take 4,200 mg by mouth daily as needed.   ELIQUIS 5 MG TABS tablet TAKE 1 TABLET BY MOUTH TWICE A DAY   furosemide (LASIX) 20 MG tablet TAKE 1 TABLET BY MOUTH EVERY DAY FOR 3 DAYS FOR SWELLING, THEN AS NEEDED   glucose blood (ONETOUCH ULTRA) test strip USE TO TEST BLOOD SUGAR DAILY   hydrochlorothiazide (HYDRODIURIL) 25 MG tablet  TAKE 1 TABLET BY MOUTH EVERY DAY   hydrOXYzine (ATARAX) 10 MG tablet Take 1 tablet (10 mg total) by mouth 3 (three) times daily as needed.   losartan (COZAAR) 25 MG tablet Take 1 tablet (25 mg total) by mouth daily.   meloxicam (MOBIC) 7.5 MG tablet TAKE 1 TABLET BY MOUTH EVERY DAY   MYRBETRIQ 25 MG TB24 tablet TAKE 1 TABLET BY MOUTH EVERY DAY   omeprazole (PRILOSEC) 40 MG capsule TAKE 1 CAPSULE (40 MG TOTAL) BY MOUTH DAILY.   OneTouch Delica Lancets 51Z MISC USE TO TEST BLOOD SUGAR ONCE DAILY   Selenium 200 MCG CAPS Take by mouth daily.   sitaGLIPtin (JANUVIA) 25 MG tablet Take 1 tablet (25 mg total) by mouth daily.   TURMERIC PO Take 500 mg by mouth daily.   vitamin B-12 (CYANOCOBALAMIN) 500 MCG tablet Take 500 mcg by mouth daily.   vitamin E 100 UNIT capsule Take 100 Units by mouth daily.   No facility-administered encounter medications on file as of 12/14/2021.    Surgical History: Past Surgical History:  Procedure Laterality Date   CATARACT EXTRACTION W/PHACO Right 10/21/2018   Procedure: CATARACT EXTRACTION PHACO AND INTRAOCULAR LENS PLACEMENT (IOC) RIGHT VISION BLUE DIABETIC  08.27.0  24.8%  126.0;  Surgeon: Birder Robson, MD;  Location: [facility];  Service: Ophthalmology;  Laterality: Right;  diabetic - oral meds   CATARACT EXTRACTION W/PHACO Left 11/18/2018   Procedure: CATARACT EXTRACTION PHACO AND INTRAOCULAR LENS PLACEMENT (IOC) LEFT DIABETIC 01:18.5     15.8%    12.45;  Surgeon: Birder Robson, MD;  Location: [facility] [place];  Service: Ophthalmology;  Laterality: Left;  DIABETIC   TUBAL LIGATION  1966    Medical History: Past Medical History:  Diagnosis Date   A-fib ([place])    Diabetes mellitus, type 2 (HCC)    Gout    feet   Hypertension    MGUS (monoclonal gammopathy of unknown significance)    Osteoporosis    Thyroid nodule     Family History: Family History  Problem Relation Age of Onset   Asthma Sister    Breast cancer Neg Hx     Social  History   Socioeconomic History   Marital status: Divorced    Spouse name: Not on file   Number of children: Not on file   Years of education: Not on file   Highest education level: Not on file  Occupational History   Not on file  Tobacco Use   Smoking status: Never   Smokeless tobacco: Never  Vaping Use   Vaping Use: Never used  Substance and Sexual Activity   Alcohol use: Never   Drug use: Never   Sexual activity: Not on file  Other Topics Concern   Not on file  Social History Narrative   Not on file   Social Determinants of Health   Financial Resource Strain: Not on file  Food Insecurity: Not on file  Transportation Needs: Not on file  Physical Activity: Not on file  Stress: Not on file  Social Connections: Not on file  Intimate Partner Violence: Not on file      Review of Systems  Constitutional:  Negative for chills, fatigue and unexpected weight change.  HENT:  Negative for congestion, rhinorrhea, sneezing and sore throat.   Eyes:  Negative for redness.  Respiratory:  Negative for cough, chest tightness, shortness of breath and wheezing.   Cardiovascular:  Negative for chest pain and palpitations.  Gastrointestinal:  Negative for abdominal pain, constipation, diarrhea, nausea and vomiting.  Genitourinary:  Negative for dysuria and frequency.  Musculoskeletal:  Positive for arthralgias. Negative for back pain, joint swelling and neck pain.  Skin:  Negative for rash.  Neurological: Negative.  Negative for dizziness, tremors, light-headedness and numbness.  Hematological:  Negative for adenopathy. Does not bruise/bleed easily.  Psychiatric/Behavioral:  Negative for behavioral problems (Depression), sleep disturbance and suicidal ideas. The patient is not nervous/anxious.     Vital Signs: BP 130/78 Comment: 124/91  Pulse (!) 56   Temp 97.6 F (36.4 C)   Resp 16   Ht '5\' 4"'$  (1.626 m)   Wt 175 lb 3.2 oz (79.5 kg)   SpO2 99%   BMI 30.07 kg/m    Physical  Exam Vitals and nursing note reviewed.  Constitutional:      General: She is not in acute distress.    Appearance: Normal appearance. She is normal weight. She is not ill-appearing.  HENT:     Head: Normocephalic and atraumatic.  Eyes:     Extraocular Movements: Extraocular movements intact.     Pupils: Pupils are equal, round, and reactive to light.  Cardiovascular:     Rate and Rhythm: Regular rhythm. Bradycardia present.  Pulmonary:     Effort: Pulmonary effort is normal. No respiratory distress.  Skin:    General: Skin  is warm and dry.  Neurological:     Mental Status: She is alert and oriented to person, place, and time.     Cranial Nerves: No cranial nerve deficit.     Coordination: Coordination normal.     Gait: Gait abnormal.  Psychiatric:        Mood and Affect: Mood normal.        Behavior: Behavior normal.        Assessment/Plan: 1. Type 2 diabetes mellitus with stage 3a chronic kidney disease, without long-term current use of insulin (HCC) - POCT HgB A1C is 6.2 which is elevated from 5.6 last visit. Will continue [place] and work on diet. - Urine Microalbumin w/creat. ratio  2. Essential hypertension Stable BP though HR a little low in office. Pt denies any symptoms or dizziness. Will continue current medications and monitor closely  3. Thyroid nodule Will update thyroid US for further evaluation and update labs - TSH + free T4 - US THYROID; Future  4. Mixed hyperlipidemia Continue lipitor and will update labs - Lipid Panel With LDL/HDL Ratio  5. Vitamin D deficiency - VITAMIN D 25 Hydroxy (Vit-D Deficiency, Fractures)  6. Other fatigue - CBC w/Diff/Platelet - Comprehensive metabolic panel - TSH + free T4 - Lipid Panel With LDL/HDL Ratio - VITAMIN D 25 Hydroxy (Vit-D Deficiency, Fractures)   General Counseling: Branae verbalizes understanding of the findings of todays visit and agrees with plan of treatment. I have discussed any further diagnostic  evaluation that may be needed or ordered today. We also reviewed her medications today. she has been encouraged to call the office with any questions or concerns that should arise related to todays visit.    Orders Placed This Encounter  Procedures   US THYROID   Urine Microalbumin w/creat. ratio   CBC w/Diff/Platelet   Comprehensive metabolic panel   TSH + free T4   Lipid Panel With LDL/HDL Ratio   VITAMIN D 25 Hydroxy (Vit-D Deficiency, Fractures)   POCT HgB A1C    No orders of the defined types were placed in this encounter.   This patient was seen by Drema Dallas, PA-C in collaboration with Dr. Clayborn Bigness as a part of collaborative care agreement.   Total time spent:30 Minutes Time spent includes review of chart, medications, test results, and follow up plan with the patient.      Dr Lavera Guise Internal medicine

## 2021-12-15 ENCOUNTER — Ambulatory Visit: Payer: Medicare Other | Admitting: Physician Assistant

## 2021-12-16 LAB — MICROALBUMIN / CREATININE URINE RATIO
Creatinine, Urine: 80.8 mg/dL
Microalb/Creat Ratio: 14 mg/g creat (ref 0–29)
Microalbumin, Urine: 11.2 ug/mL

## 2021-12-25 ENCOUNTER — Ambulatory Visit: Payer: Medicare Other

## 2021-12-27 ENCOUNTER — Ambulatory Visit
Admission: RE | Admit: 2021-12-27 | Discharge: 2021-12-27 | Disposition: A | Discharge status: Home / Self Care | Payer: Medicare Other | Source: Ambulatory Visit | Attending: Physician Assistant | Admitting: Physician Assistant

## 2021-12-27 DIAGNOSIS — E041 Nontoxic single thyroid nodule: Secondary | ICD-10-CM | POA: Insufficient documentation

## 2022-01-02 LAB — CBC WITH DIFFERENTIAL/PLATELET
Basophils Absolute: 0 10*3/uL (ref 0.0–0.2)
Basos: 1 %
EOS (ABSOLUTE): 0.1 10*3/uL (ref 0.0–0.4)
Eos: 1 %
Hematocrit: 41.8 % (ref 34.0–46.6)
Hemoglobin: 14.4 g/dL (ref 11.1–15.9)
Immature Grans (Abs): 0 10*3/uL (ref 0.0–0.1)
Immature Granulocytes: 0 %
Lymphocytes Absolute: 2.2 10*3/uL (ref 0.7–3.1)
Lymphs: 37 %
MCH: 35.5 pg — ABNORMAL HIGH (ref 26.6–33.0)
MCHC: 34.4 g/dL (ref 31.5–35.7)
MCV: 103 fL — ABNORMAL HIGH (ref 79–97)
Monocytes Absolute: 0.7 10*3/uL (ref 0.1–0.9)
Monocytes: 12 %
Neutrophils Absolute: 2.9 10*3/uL (ref 1.4–7.0)
Neutrophils: 49 %
Platelets: 148 10*3/uL — ABNORMAL LOW (ref 150–450)
RBC: 4.06 x10E6/uL (ref 3.77–5.28)
RDW: 12.3 % (ref 11.7–15.4)
WBC: 5.9 10*3/uL (ref 3.4–10.8)

## 2022-01-02 LAB — COMPREHENSIVE METABOLIC PANEL
ALT: 59 IU/L — ABNORMAL HIGH (ref 0–32)
AST: 40 IU/L (ref 0–40)
Albumin/Globulin Ratio: 1.3 (ref 1.2–2.2)
Albumin: 4.4 g/dL (ref 3.8–4.8)
Alkaline Phosphatase: 183 IU/L — ABNORMAL HIGH (ref 44–121)
BUN/Creatinine Ratio: 15 (ref 12–28)
BUN: 17 mg/dL (ref 8–27)
Bilirubin Total: 0.6 mg/dL (ref 0.0–1.2)
CO2: 26 mmol/L (ref 20–29)
Calcium: 11 mg/dL — ABNORMAL HIGH (ref 8.7–10.3)
Chloride: 95 mmol/L — ABNORMAL LOW (ref 96–106)
Creatinine, Ser: 1.16 mg/dL — ABNORMAL HIGH (ref 0.57–1.00)
Globulin, Total: 3.3 g/dL (ref 1.5–4.5)
Glucose: 130 mg/dL — ABNORMAL HIGH (ref 70–99)
Potassium: 4.5 mmol/L (ref 3.5–5.2)
Sodium: 136 mmol/L (ref 134–144)
Total Protein: 7.7 g/dL (ref 6.0–8.5)
eGFR: 48 mL/min/{1.73_m2} — ABNORMAL LOW (ref >59)

## 2022-01-02 LAB — TSH+FREE T4
Free T4: 1.12 ng/dL (ref 0.82–1.77)
TSH: 4.13 u[IU]/mL (ref 0.450–4.500)

## 2022-01-02 LAB — LIPID PANEL WITH LDL/HDL RATIO
Cholesterol, Total: 148 mg/dL (ref 100–199)
HDL: 74 mg/dL (ref >39)
LDL Chol Calc (NIH): 58 mg/dL (ref 0–99)
LDL/HDL Ratio: 0.8 ratio (ref 0.0–3.2)
Triglycerides: 84 mg/dL (ref 0–149)
VLDL Cholesterol Cal: 16 mg/dL (ref 5–40)

## 2022-01-02 LAB — VITAMIN D 25 HYDROXY (VIT D DEFICIENCY, FRACTURES): Vit D, 25-Hydroxy: 41.8 ng/mL (ref 30.0–100.0)

## 2022-01-18 ENCOUNTER — Ambulatory Visit (INDEPENDENT_AMBULATORY_CARE_PROVIDER_SITE_OTHER): Payer: Medicare Other | Admitting: Physician Assistant

## 2022-01-18 ENCOUNTER — Encounter: Payer: Self-pay | Admitting: Physician Assistant

## 2022-01-18 VITALS — BP 138/70 | HR 88 | Temp 97.8°F | Resp 16 | Ht 64.0 in | Wt 180.0 lb

## 2022-01-18 DIAGNOSIS — I10 Essential (primary) hypertension: Secondary | ICD-10-CM

## 2022-01-18 DIAGNOSIS — N1831 Chronic kidney disease, stage 3a: Secondary | ICD-10-CM | POA: Diagnosis not present

## 2022-01-18 DIAGNOSIS — E041 Nontoxic single thyroid nodule: Secondary | ICD-10-CM | POA: Diagnosis not present

## 2022-01-18 NOTE — Progress Notes (Signed)
[facility] [place], [place] [address]  Internal MEDICINE  Office Visit Note  Patient Name: Tracey Love  [id number]  [id number]  Date of Service: 01/28/2022  Chief Complaint  Patient presents with   Follow-up    Labs and U/s   Diabetes   Hypertension    HPI Pt is here for routine follow up to review thyroid US -thyroid US showed: previously visualized complex cystic and solid nodule in the right lobe of thyroid gland has involuted and appears smaller and solid.  Nodule now measures 1 cm August previously 2.2 cm in 2016.  Nodule does not meet criteria for biopsy or require further follow-up.  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  Her platelets are just borderline low.  Kidney function remains slightly abnormal and will require continued monitoring.  Calcium found to be elevated and has been the case previously.  Will have her go ahead and reduce calcium supplement.  Alkaline phosphatase continues to be elevated but is stable.  ALT also elevated.  Of note patient was previously referred to hematology/oncology for further evaluation of abnormal labs and MGUS and was discharged due to stable lab reports and patient not displaying significant symptoms.  Current Medication: Outpatient Encounter Medications as of 01/18/2022  Medication Sig   acetaminophen (TYLENOL) 500 MG tablet Take 1,000 mg by mouth every 8 (eight) hours as needed for mild pain or moderate pain.    aspirin EC 81 MG tablet Take 81 mg by mouth daily.   atorvastatin (LIPITOR) 40 MG tablet TAKE 1 TABLET BY MOUTH EVERY DAY   Cholecalciferol (VITAMIN D-3) 1000 UNITS CAPS Take 1,000 Units by mouth daily.   colchicine 0.6 MG tablet TAKE 2 TABSS ON THE FIRST DAY OF GOUT FLARE, TAKE 1 ADDITIONAL TAB 1 HR AFTER ON THE FIRST DAY. AFTER THE FIRST DAY TAKE 1 TABLET DAILY UNTIL GOUT FLARE RESOLVES. USE AS NEEDED FOR GOUT FLARES.   CRANBERRY PO  Take 4,200 mg by mouth daily as needed.   ELIQUIS 5 MG TABS tablet TAKE 1 TABLET BY MOUTH TWICE A DAY   furosemide (LASIX) 20 MG tablet TAKE 1 TABLET BY MOUTH EVERY DAY FOR 3 DAYS FOR SWELLING, THEN AS NEEDED   glucose blood (ONETOUCH ULTRA) test strip USE TO TEST BLOOD SUGAR DAILY   hydrochlorothiazide (HYDRODIURIL) 25 MG tablet TAKE 1 TABLET BY MOUTH EVERY DAY   hydrOXYzine (ATARAX) 10 MG tablet Take 1 tablet (10 mg total) by mouth 3 (three) times daily as needed.   losartan (COZAAR) 25 MG tablet Take 1 tablet (25 mg total) by mouth daily.   meloxicam (MOBIC) 7.5 MG tablet TAKE 1 TABLET BY MOUTH EVERY DAY   MYRBETRIQ 25 MG TB24 tablet TAKE 1 TABLET BY MOUTH EVERY DAY   omeprazole (PRILOSEC) 40 MG capsule TAKE 1 CAPSULE (40 MG TOTAL) BY MOUTH DAILY.   OneTouch Delica Lancets 89Q MISC USE TO TEST BLOOD SUGAR ONCE DAILY   Selenium 200 MCG CAPS Take by mouth daily.   sitaGLIPtin (JANUVIA) 25 MG tablet Take 1 tablet (25 mg total) by mouth daily.   TURMERIC PO Take 500 mg by mouth daily.   vitamin B-12 (CYANOCOBALAMIN) 500 MCG tablet Take 500 mcg by mouth daily.   vitamin E 100 UNIT capsule Take 100 Units by mouth daily.   No facility-administered encounter medications on file as of 01/18/2022.    Surgical History: Past Surgical History:  Procedure  Laterality Date   CATARACT EXTRACTION W/PHACO Right 10/21/2018   Procedure: CATARACT EXTRACTION PHACO AND INTRAOCULAR LENS PLACEMENT (IOC) RIGHT VISION BLUE DIABETIC  08.27.0  24.8%  126.0;  Surgeon: Birder Robson, MD;  Location: [facility];  Service: Ophthalmology;  Laterality: Right;  diabetic - oral meds   CATARACT EXTRACTION W/PHACO Left 11/18/2018   Procedure: CATARACT EXTRACTION PHACO AND INTRAOCULAR LENS PLACEMENT (IOC) LEFT DIABETIC 01:18.5     15.8%    12.45;  Surgeon: Birder Robson, MD;  Location: [place];  Service: Ophthalmology;  Laterality: Left;  DIABETIC   TUBAL LIGATION  1966    Medical History: Past  Medical History:  Diagnosis Date   A-fib ([place])    Diabetes mellitus, type 2 (HCC)    Gout    feet   Hypertension    MGUS (monoclonal gammopathy of unknown significance)    Osteoporosis    Thyroid nodule     Family History: Family History  Problem Relation Age of Onset   Asthma Sister    Breast cancer Neg Hx     Social History   Socioeconomic History   Marital status: Divorced    Spouse name: Not on file   Number of children: Not on file   Years of education: Not on file   Highest education level: Not on file  Occupational History   Not on file  Tobacco Use   Smoking status: Never   Smokeless tobacco: Never  Vaping Use   Vaping Use: Never used  Substance and Sexual Activity   Alcohol use: Never   Drug use: Never   Sexual activity: Not on file  Other Topics Concern   Not on file  Social History Narrative   Not on file   Social Determinants of Health   Financial Resource Strain: Not on file  Food Insecurity: Not on file  Transportation Needs: Not on file  Physical Activity: Not on file  Stress: Not on file  Social Connections: Not on file  Intimate Partner Violence: Not on file      Review of Systems  Constitutional:  Negative for chills, fatigue and unexpected weight change.  HENT:  Negative for congestion, rhinorrhea, sneezing and sore throat.   Eyes:  Negative for redness.  Respiratory:  Negative for cough, chest tightness, shortness of breath and wheezing.   Cardiovascular:  Negative for chest pain and palpitations.  Gastrointestinal:  Negative for abdominal pain, constipation, diarrhea, nausea and vomiting.  Genitourinary:  Negative for dysuria and frequency.  Musculoskeletal:  Positive for arthralgias. Negative for back pain, joint swelling and neck pain.  Skin:  Negative for rash.  Neurological: Negative.  Negative for dizziness, tremors, light-headedness and numbness.  Hematological:  Negative for adenopathy. Does not bruise/bleed easily.   Psychiatric/Behavioral:  Negative for behavioral problems (Depression), sleep disturbance and suicidal ideas. The patient is not nervous/anxious.     Vital Signs: BP 138/70   Pulse 88   Temp 97.8 F (36.6 C)   Resp 16   Ht '5\' 4"'$  (1.626 m)   Wt 180 lb (81.6 kg)   SpO2 99%   BMI 30.90 kg/m    Physical Exam Vitals and nursing note reviewed.  Constitutional:      General: She is not in acute distress.    Appearance: Normal appearance. She is normal weight. She is not ill-appearing.  HENT:     Head: Normocephalic and atraumatic.  Eyes:     Extraocular Movements: Extraocular movements intact.  Pupils: Pupils are equal, round, and reactive to light.  Cardiovascular:     Rate and Rhythm: Normal rate and regular rhythm.  Pulmonary:     Effort: Pulmonary effort is normal. No respiratory distress.  Skin:    General: Skin is warm and dry.  Neurological:     Mental Status: She is alert and oriented to person, place, and time.     Cranial Nerves: No cranial nerve deficit.     Coordination: Coordination normal.     Gait: Gait abnormal.  Psychiatric:        Mood and Affect: Mood normal.        Behavior: Behavior normal.        Assessment/Plan: 1. Thyroid nodule Reduction in size from previous, no biopsy or follow up indicated  2. Essential hypertension Stable, continue current medications  3. Hypercalcemia Will reduce calcium supplement  4. Stage 3a chronic kidney disease ([place]) Stable, continue to monitor   General Counseling: Charmel verbalizes understanding of the findings of todays visit and agrees with plan of treatment. I have discussed any further diagnostic evaluation that may be needed or ordered today. We also reviewed her medications today. she has been encouraged to call the office with any questions or concerns that should arise related to todays visit.    No orders of the defined types were placed in this encounter.   No orders of the defined types  were placed in this encounter.   This patient was seen by Drema Dallas, PA-C in collaboration with Dr. Clayborn Bigness as a part of collaborative care agreement.   Total time spent:30 Minutes Time spent includes review of chart, medications, test results, and follow up plan with the patient.      Dr Lavera Guise Internal medicine

## 2022-02-08 ENCOUNTER — Other Ambulatory Visit: Payer: Self-pay | Admitting: Physician Assistant

## 2022-02-08 DIAGNOSIS — M10071 Idiopathic gout, right ankle and foot: Secondary | ICD-10-CM

## 2022-02-08 DIAGNOSIS — D696 Thrombocytopenia, unspecified: Secondary | ICD-10-CM

## 2022-02-08 DIAGNOSIS — E1122 Type 2 diabetes mellitus with diabetic chronic kidney disease: Secondary | ICD-10-CM

## 2022-02-08 DIAGNOSIS — D472 Monoclonal gammopathy: Secondary | ICD-10-CM

## 2022-02-26 ENCOUNTER — Other Ambulatory Visit: Payer: Self-pay | Admitting: Physician Assistant

## 2022-02-26 DIAGNOSIS — E1122 Type 2 diabetes mellitus with diabetic chronic kidney disease: Secondary | ICD-10-CM

## 2022-03-07 ENCOUNTER — Other Ambulatory Visit: Payer: Self-pay | Admitting: Physician Assistant

## 2022-03-07 ENCOUNTER — Other Ambulatory Visit: Payer: Self-pay | Admitting: Internal Medicine

## 2022-03-07 DIAGNOSIS — E782 Mixed hyperlipidemia: Secondary | ICD-10-CM

## 2022-03-07 DIAGNOSIS — N1831 Chronic kidney disease, stage 3a: Secondary | ICD-10-CM

## 2022-03-09 ENCOUNTER — Telehealth: Payer: Self-pay | Admitting: Physician Assistant

## 2022-03-09 NOTE — Telephone Encounter (Signed)
Received vm from patient on after-hour line regarding her Januvia. I attempted to call her back. No answer, no vm. I lvm with her daughter to let her know rx was sent to [facility] on 03/07/22 for a 90 day supply-Toni

## 2022-03-22 ENCOUNTER — Ambulatory Visit: Payer: Medicare Other | Admitting: Physician Assistant

## 2022-04-15 ENCOUNTER — Other Ambulatory Visit: Payer: Self-pay | Admitting: Physician Assistant

## 2022-04-16 ENCOUNTER — Ambulatory Visit (INDEPENDENT_AMBULATORY_CARE_PROVIDER_SITE_OTHER): Payer: 59 | Admitting: Physician Assistant

## 2022-04-16 ENCOUNTER — Encounter: Payer: Self-pay | Admitting: Physician Assistant

## 2022-04-16 VITALS — BP 126/72 | HR 60 | Temp 98.3°F | Resp 16 | Ht 64.0 in | Wt 182.0 lb

## 2022-04-16 DIAGNOSIS — N1831 Chronic kidney disease, stage 3a: Secondary | ICD-10-CM | POA: Diagnosis not present

## 2022-04-16 DIAGNOSIS — I10 Essential (primary) hypertension: Secondary | ICD-10-CM | POA: Diagnosis not present

## 2022-04-16 DIAGNOSIS — E1122 Type 2 diabetes mellitus with diabetic chronic kidney disease: Secondary | ICD-10-CM | POA: Diagnosis not present

## 2022-04-16 DIAGNOSIS — M064 Inflammatory polyarthropathy: Secondary | ICD-10-CM

## 2022-04-16 MED ORDER — MELOXICAM 7.5 MG PO TABS: 7.5000 mg | ORAL_TABLET | Freq: Every day | ORAL | 2 refills | Status: DC

## 2022-04-16 NOTE — Progress Notes (Signed)
[facility] [place], [place] [address]  Internal MEDICINE  Office Visit Note  Patient Name: Tracey Love  [id number]  [id number]  Date of Service: 04/16/2022  Chief Complaint  Patient presents with   Follow-up   Diabetes   Hypertension    HPI Pt is here for routine follow up and is doing well today. She has no complaints -BP stable -BG fluctuate at home based on what she eats and discussed this is normal -Taking meloxicam occasionally as needed, knows to avoid taking frequently if not needed. Would like to have refill on hand if needed  Current Medication: Outpatient Encounter Medications as of 04/16/2022  Medication Sig   acetaminophen (TYLENOL) 500 MG tablet Take 1,000 mg by mouth every 8 (eight) hours as needed for mild pain or moderate pain.    aspirin EC 81 MG tablet Take 81 mg by mouth daily.   atorvastatin (LIPITOR) 40 MG tablet TAKE 1 TABLET BY MOUTH EVERY DAY   Cholecalciferol (VITAMIN D-3) 1000 UNITS CAPS Take 1,000 Units by mouth daily.   colchicine 0.6 MG tablet TAKE 2 TABS ON THE DAY OF GOUT FLARE, TAKE 1 ADDITIONAL TAB 1 HR AFTER ON THE FIRST DAY. AFTER THE FIRST DAY TAKE 1 TABLET DAILY UNTIL GOUT FLARE RESOLVES. USE AS NEEDED FOR GOUT FLARES.   CRANBERRY PO Take 4,200 mg by mouth daily as needed.   furosemide (LASIX) 20 MG tablet TAKE 1 TABLET BY MOUTH EVERY DAY FOR 3 DAYS FOR SWELLING, THEN AS NEEDED   hydrochlorothiazide (HYDRODIURIL) 25 MG tablet TAKE 1 TABLET BY MOUTH EVERY DAY   hydrOXYzine (ATARAX) 10 MG tablet Take 1 tablet (10 mg total) by mouth 3 (three) times daily as needed.   JANUVIA 25 MG tablet TAKE 1 TABLET (25 MG TOTAL) BY MOUTH DAILY.   Lancets (ONETOUCH DELICA PLUS 123XX123) MISC USE TO TEST BLOOD SUGAR ONCE DAILY   losartan (COZAAR) 25 MG tablet Take 1 tablet (25 mg total) by mouth daily.   MYRBETRIQ 25 MG TB24 tablet TAKE 1 TABLET BY MOUTH EVERY DAY   omeprazole (PRILOSEC) 40 MG capsule TAKE 1 CAPSULE (40 MG TOTAL) BY  MOUTH DAILY.   ONETOUCH ULTRA test strip USE TO TEST BLOOD SUGAR DAILY   Selenium 200 MCG CAPS Take by mouth daily.   TURMERIC PO Take 500 mg by mouth daily.   vitamin B-12 (CYANOCOBALAMIN) 500 MCG tablet Take 500 mcg by mouth daily.   vitamin E 100 UNIT capsule Take 100 Units by mouth daily.   [DISCONTINUED] ELIQUIS 5 MG TABS tablet TAKE 1 TABLET BY MOUTH TWICE A DAY   [DISCONTINUED] meloxicam (MOBIC) 7.5 MG tablet TAKE 1 TABLET BY MOUTH EVERY DAY   meloxicam (MOBIC) 7.5 MG tablet Take 1 tablet (7.5 mg total) by mouth daily. As needed   No facility-administered encounter medications on file as of 04/16/2022.    Surgical History: Past Surgical History:  Procedure Laterality Date   CATARACT EXTRACTION W/PHACO Right 10/21/2018   Procedure: CATARACT EXTRACTION PHACO AND INTRAOCULAR LENS PLACEMENT (IOC) RIGHT VISION BLUE DIABETIC  08.27.0  24.8%  126.0;  Surgeon: Birder Robson, MD;  Location: [facility];  Service: Ophthalmology;  Laterality: Right;  diabetic - oral meds   CATARACT EXTRACTION W/PHACO Left 11/18/2018   Procedure: CATARACT EXTRACTION PHACO AND INTRAOCULAR LENS PLACEMENT (IOC) LEFT DIABETIC 01:18.5     15.8%    12.45;  Surgeon: Birder Robson, MD;  [facility];  Service: Ophthalmology;  Laterality: Left;  DIABETIC   TUBAL LIGATION  1966    Medical History: Past Medical History:  Diagnosis Date   A-fib ([place])    Diabetes mellitus, type 2 (HCC)    Gout    feet   Hypertension    MGUS (monoclonal gammopathy of unknown significance)    Osteoporosis    Thyroid nodule     Family History: Family History  Problem Relation Age of Onset   Asthma Sister    Breast cancer Neg Hx     Social History   Socioeconomic History   Marital status: Divorced    Spouse name: Not on file   Number of children: Not on file   Years of education: Not on file   Highest education level: Not on file  Occupational History   Not on file  Tobacco Use   Smoking  status: Never   Smokeless tobacco: Never  Vaping Use   Vaping Use: Never used  Substance and Sexual Activity   Alcohol use: Never   Drug use: Never   Sexual activity: Not on file  Other Topics Concern   Not on file  Social History Narrative   Not on file   Social Determinants of Health   Financial Resource Strain: Not on file  Food Insecurity: Not on file  Transportation Needs: Not on file  Physical Activity: Not on file  Stress: Not on file  Social Connections: Not on file  Intimate Partner Violence: Not on file      Review of Systems  Constitutional:  Negative for chills, fatigue and unexpected weight change.  HENT:  Negative for congestion, rhinorrhea, sneezing and sore throat.   Eyes:  Negative for redness.  Respiratory:  Negative for cough, chest tightness, shortness of breath and wheezing.   Cardiovascular:  Negative for chest pain and palpitations.  Gastrointestinal:  Negative for abdominal pain, constipation, diarrhea, nausea and vomiting.  Genitourinary:  Negative for dysuria and frequency.  Musculoskeletal:  Positive for arthralgias. Negative for back pain, joint swelling and neck pain.  Skin:  Negative for rash.  Neurological: Negative.  Negative for dizziness, tremors, light-headedness and numbness.  Hematological:  Negative for adenopathy. Does not bruise/bleed easily.  Psychiatric/Behavioral:  Negative for behavioral problems (Depression), sleep disturbance and suicidal ideas. The patient is not nervous/anxious.     Vital Signs: BP 126/72   Pulse 60   Temp 98.3 F (36.8 C)   Resp 16   Ht '5\' 4"'$  (1.626 m)   Wt 182 lb (82.6 kg)   SpO2 98%   BMI 31.24 kg/m    Physical Exam Vitals and nursing note reviewed.  Constitutional:      General: She is not in acute distress.    Appearance: Normal appearance. She is normal weight. She is not ill-appearing.  HENT:     Head: Normocephalic and atraumatic.  Eyes:     Extraocular Movements: Extraocular  movements intact.     Pupils: Pupils are equal, round, and reactive to light.  Cardiovascular:     Rate and Rhythm: Normal rate and regular rhythm.  Pulmonary:     Effort: Pulmonary effort is normal. No respiratory distress.  Skin:    General: Skin is warm and dry.  Neurological:     Mental Status: She is alert and oriented to person, place, and time.     Cranial Nerves: No cranial nerve deficit.     Coordination: Coordination normal.     Gait: Gait abnormal.  Psychiatric:  Mood and Affect: Mood normal.        Behavior: Behavior normal.        Assessment/Plan: 1. Essential hypertension Stable, continue current medications  2. Type 2 diabetes mellitus with stage 3a chronic kidney disease, without long-term current use of insulin (HCC) Stable, continue current medication and will recheck A1c next visit  3. Inflammatory polyarthropathy of multiple sites [facility]) May continue mobic only as needed - meloxicam (MOBIC) 7.5 MG tablet; Take 1 tablet (7.5 mg total) by mouth daily. As needed  Dispense: 30 tablet; Refill: 2   General Counseling: Emslee verbalizes understanding of the findings of todays visit and agrees with plan of treatment. I have discussed any further diagnostic evaluation that may be needed or ordered today. We also reviewed her medications today. she has been encouraged to call the office with any questions or concerns that should arise related to todays visit.    No orders of the defined types were placed in this encounter.   Meds ordered this encounter  Medications   meloxicam (MOBIC) 7.5 MG tablet    Sig: Take 1 tablet (7.5 mg total) by mouth daily. As needed    Dispense:  30 tablet    Refill:  2    This patient was seen by Drema Dallas, PA-C in collaboration with Dr. Clayborn Bigness as a part of collaborative care agreement.   Total time spent:30 Minutes Time spent includes review of chart, medications, test results, and follow up plan with the  patient.      Dr Lavera Guise Internal medicine

## 2022-05-12 ENCOUNTER — Other Ambulatory Visit: Payer: Self-pay | Admitting: Physician Assistant

## 2022-05-24 DIAGNOSIS — E119 Type 2 diabetes mellitus without complications: Secondary | ICD-10-CM | POA: Diagnosis not present

## 2022-05-24 DIAGNOSIS — H2000 Unspecified acute and subacute iridocyclitis: Secondary | ICD-10-CM | POA: Diagnosis not present

## 2022-05-24 DIAGNOSIS — H26492 Other secondary cataract, left eye: Secondary | ICD-10-CM | POA: Diagnosis not present

## 2022-05-24 LAB — HM DIABETES EYE EXAM

## 2022-06-15 ENCOUNTER — Other Ambulatory Visit: Payer: Self-pay | Admitting: Internal Medicine

## 2022-06-15 DIAGNOSIS — N1831 Chronic kidney disease, stage 3a: Secondary | ICD-10-CM

## 2022-06-24 ENCOUNTER — Other Ambulatory Visit: Payer: Self-pay | Admitting: Physician Assistant

## 2022-06-24 DIAGNOSIS — R600 Localized edema: Secondary | ICD-10-CM

## 2022-06-28 ENCOUNTER — Encounter: Payer: Self-pay | Admitting: Physician Assistant

## 2022-06-28 ENCOUNTER — Ambulatory Visit (INDEPENDENT_AMBULATORY_CARE_PROVIDER_SITE_OTHER): Payer: 59 | Admitting: Physician Assistant

## 2022-06-28 VITALS — BP 130/85 | HR 75 | Temp 98.4°F | Resp 16 | Ht 64.0 in | Wt 184.6 lb

## 2022-06-28 DIAGNOSIS — N1831 Chronic kidney disease, stage 3a: Secondary | ICD-10-CM

## 2022-06-28 DIAGNOSIS — I10 Essential (primary) hypertension: Secondary | ICD-10-CM | POA: Diagnosis not present

## 2022-06-28 DIAGNOSIS — E538 Deficiency of other specified B group vitamins: Secondary | ICD-10-CM

## 2022-06-28 DIAGNOSIS — E1122 Type 2 diabetes mellitus with diabetic chronic kidney disease: Secondary | ICD-10-CM

## 2022-06-28 DIAGNOSIS — R5383 Other fatigue: Secondary | ICD-10-CM | POA: Diagnosis not present

## 2022-06-28 DIAGNOSIS — L602 Onychogryphosis: Secondary | ICD-10-CM | POA: Diagnosis not present

## 2022-06-28 DIAGNOSIS — Z0001 Encounter for general adult medical examination with abnormal findings: Secondary | ICD-10-CM

## 2022-06-28 DIAGNOSIS — R300 Dysuria: Secondary | ICD-10-CM | POA: Diagnosis not present

## 2022-06-28 LAB — POCT GLYCOSYLATED HEMOGLOBIN (HGB A1C): Hemoglobin A1C: 7 % — AB (ref 4.0–5.6)

## 2022-06-28 MED ORDER — SITAGLIPTIN PHOSPHATE 50 MG PO TABS: 50.0000 mg | ORAL_TABLET | Freq: Every day | ORAL | 1 refills | Status: DC

## 2022-06-28 NOTE — Progress Notes (Signed)
[facility] [address] [place], [place] [address]  Internal MEDICINE  Office Visit Note  Patient Name: Tracey Love  [id number]  [id number]  Date of Service: 06/28/2022  Chief Complaint  Patient presents with   Medicare Wellness   Diabetes   Hypertension     HPI Pt is here for routine health maintenance examination -UTD on eye exam -BP stable -BG 190 this morning, A1c is up and will adjust meds and work on diet -still having some itching, taking hydroxyzine, will try swtiching detergent/lotion for sensitive skin -Needs podiatry referral for nail management as they are very long and thick and she is a diabetic   Current Medication: Outpatient Encounter Medications as of 06/28/2022  Medication Sig   acetaminophen (TYLENOL) 500 MG tablet Take 1,000 mg by mouth every 8 (eight) hours as needed for mild pain or moderate pain.    aspirin EC 81 MG tablet Take 81 mg by mouth daily.   atorvastatin (LIPITOR) 40 MG tablet TAKE 1 TABLET BY MOUTH EVERY DAY   Cholecalciferol (VITAMIN D-3) 1000 UNITS CAPS Take 1,000 Units by mouth daily.   colchicine 0.6 MG tablet TAKE 2 TABS ON THE DAY OF GOUT FLARE, TAKE 1 ADDITIONAL TAB 1 HR AFTER ON THE FIRST DAY. AFTER THE FIRST DAY TAKE 1 TABLET DAILY UNTIL GOUT FLARE RESOLVES. USE AS NEEDED FOR GOUT FLARES.   CRANBERRY PO Take 4,200 mg by mouth daily as needed.   ELIQUIS 5 MG TABS tablet TAKE 1 TABLET BY MOUTH TWICE A DAY   furosemide (LASIX) 20 MG tablet TAKE 1 TABLET BY MOUTH EVERY DAY FOR 3 DAYS FOR SWELLING, THEN AS NEEDED   hydrochlorothiazide (HYDRODIURIL) 25 MG tablet TAKE 1 TABLET BY MOUTH EVERY DAY   hydrOXYzine (ATARAX) 10 MG tablet Take 1 tablet (10 mg total) by mouth 3 (three) times daily as needed.   Lancets (ONETOUCH DELICA PLUS LANCET33G) MISC USE TO TEST BLOOD SUGAR ONCE DAILY   losartan (COZAAR) 25 MG tablet TAKE 1 TABLET (25 MG TOTAL) BY MOUTH DAILY.   meloxicam (MOBIC) 7.5 MG tablet Take 1 tablet (7.5 mg total) by  mouth daily. As needed   MYRBETRIQ 25 MG TB24 tablet TAKE 1 TABLET BY MOUTH EVERY DAY   omeprazole (PRILOSEC) 40 MG capsule TAKE 1 CAPSULE (40 MG TOTAL) BY MOUTH DAILY.   ONETOUCH ULTRA test strip USE TO TEST BLOOD SUGAR DAILY   Selenium 200 MCG CAPS Take by mouth daily.   sitaGLIPtin (JANUVIA) 50 MG tablet Take 1 tablet (50 mg total) by mouth daily.   TURMERIC PO Take 500 mg by mouth daily.   vitamin B-12 (CYANOCOBALAMIN) 500 MCG tablet Take 500 mcg by mouth daily.   vitamin E 100 UNIT capsule Take 100 Units by mouth daily.   [DISCONTINUED] JANUVIA 25 MG tablet TAKE 1 TABLET (25 MG TOTAL) BY MOUTH DAILY.   No facility-administered encounter medications on file as of 06/28/2022.    Surgical History: Past Surgical History:  Procedure Laterality Date   CATARACT EXTRACTION W/PHACO Right 10/21/2018   Procedure: CATARACT EXTRACTION PHACO AND INTRAOCULAR LENS PLACEMENT (IOC) RIGHT VISION BLUE DIABETIC  08.27.0  24.8%  126.0;  Surgeon: Galen Manila, MD;  Location: [facility];  Service: Ophthalmology;  Laterality: Right;  diabetic - oral meds   CATARACT EXTRACTION W/PHACO Left 11/18/2018   Procedure: CATARACT EXTRACTION PHACO AND INTRAOCULAR LENS PLACEMENT (IOC) LEFT DIABETIC 01:18.5     15.8%    12.45;  Surgeon: Galen Manila, MD;  Location: [facility];  Service: Ophthalmology;  Laterality: Left;  DIABETIC   TUBAL LIGATION  1966    Medical History: Past Medical History:  Diagnosis Date   A-fib (HCC)    Diabetes mellitus, type 2 (HCC)    Gout    feet   Hypertension    MGUS (monoclonal gammopathy of unknown significance)    Osteoporosis    Thyroid nodule     Family History: Family History  Problem Relation Age of Onset   Asthma Sister    Breast cancer Neg Hx       Review of Systems  Constitutional:  Negative for chills, fatigue and unexpected weight change.  HENT:  Negative for congestion, rhinorrhea, sneezing and sore throat.   Eyes:  Negative for  redness.  Respiratory:  Negative for cough, chest tightness, shortness of breath and wheezing.   Cardiovascular:  Negative for chest pain and palpitations.  Gastrointestinal:  Negative for abdominal pain, constipation, diarrhea, nausea and vomiting.  Genitourinary:  Negative for dysuria and frequency.  Musculoskeletal:  Positive for arthralgias. Negative for back pain, joint swelling and neck pain.  Skin:  Negative for rash.  Neurological: Negative.  Negative for dizziness, tremors, light-headedness and numbness.  Hematological:  Negative for adenopathy. Does not bruise/bleed easily.  Psychiatric/Behavioral:  Negative for behavioral problems (Depression), sleep disturbance and suicidal ideas. The patient is not nervous/anxious.      Vital Signs: BP 130/85   Pulse 75   Temp 98.4 F (36.9 C)   Resp 16   Ht 5\' 4"  (1.626 m)   Wt 184 lb 9.6 oz (83.7 kg)   SpO2 97%   BMI 31.69 kg/m    Physical Exam Vitals and nursing note reviewed.  Constitutional:      General: She is not in acute distress.    Appearance: Normal appearance. She is normal weight. She is not ill-appearing.  HENT:     Head: Normocephalic and atraumatic.  Eyes:     Extraocular Movements: Extraocular movements intact.     Pupils: Pupils are equal, round, and reactive to light.  Cardiovascular:     Rate and Rhythm: Normal rate and regular rhythm.     Pulses:          Dorsalis pedis pulses are 3+ on the right side and 3+ on the left side.       Posterior tibial pulses are 3+ on the right side and 3+ on the left side.  Pulmonary:     Effort: Pulmonary effort is normal. No respiratory distress.  Abdominal:     General: Abdomen is flat.     Palpations: Abdomen is soft.     Tenderness: There is no abdominal tenderness.  Musculoskeletal:     Right foot: Normal range of motion.     Left foot: Normal range of motion.  Feet:     Right foot:     Protective Sensation: 2 sites tested.  2 sites sensed.     Skin  integrity: Dry skin present.     Toenail Condition: Right toenails are abnormally thick and long.     Left foot:     Protective Sensation: 2 sites tested.  2 sites sensed.     Skin integrity: Dry skin present.     Toenail Condition: Left toenails are abnormally thick and long.  Skin:    General: Skin is warm and dry.  Neurological:     Mental Status: She is alert and oriented to person, place, and time.  Cranial Nerves: No cranial nerve deficit.     Coordination: Coordination normal.     Gait: Gait abnormal.  Psychiatric:        Mood and Affect: Mood normal.        Behavior: Behavior normal.      LABS: Recent Results (from the past 2160 hour(s))  HM DIABETES EYE EXAM     Status: None   Collection Time: 05/24/22 12:00 AM  Result Value Ref Range   HM Diabetic Eye Exam No Retinopathy No Retinopathy  POCT HgB A1C     Status: Abnormal   Collection Time: 06/28/22  9:00 AM  Result Value Ref Range   Hemoglobin A1C 7.0 (A) 4.0 - 5.6 %   HbA1c POC (<> result, manual entry)     HbA1c, POC (prediabetic range)     HbA1c, POC (controlled diabetic range)          Assessment/Plan: 1. Encounter for general adult medical examination with abnormal findings CPE performed, UTD on PHM  2. Essential hypertension Stable, continue current medications  3. Type 2 diabetes mellitus with stage 3a chronic kidney disease, without long-term current use of insulin (HCC) - POCT HgB A1C 7.0 which is increased from 6.2 last visit. Will double Januvia to 50mg  and will work on diet and exercise. Will refer to podiatry for nail management as diabetic. May also benefit from diabetic shoes and will discuss with podiatry. - sitaGLIPtin (JANUVIA) 50 MG tablet; Take 1 tablet (50 mg total) by mouth daily.  Dispense: 90 tablet; Refill: 1 - Ambulatory referral to Podiatry  4. Stage 3a chronic kidney disease (HCC) - Comprehensive metabolic panel  5. Hypercalcemia - Comprehensive metabolic panel  6. B12  deficiency - B12 and Folate Panel  7. Long toenail - Ambulatory referral to Podiatry  8. Hypertrophic toenail - Ambulatory referral to Podiatry  9. Other fatigue - Comprehensive metabolic panel - CBC w/Diff/Platelet - B12 and Folate Panel  10. Dysuria - UA/M w/rflx Culture, Routine   General Counseling: Tracey Love verbalizes understanding of the findings of todays visit and agrees with plan of treatment. I have discussed any further diagnostic evaluation that may be needed or ordered today. We also reviewed her medications today. she has been encouraged to call the office with any questions or concerns that should arise related to todays visit.    Counseling:    Orders Placed This Encounter  Procedures   UA/M w/rflx Culture, Routine   Comprehensive metabolic panel   CBC w/Diff/Platelet   B12 and Folate Panel   Ambulatory referral to Podiatry   POCT HgB A1C    Meds ordered this encounter  Medications   sitaGLIPtin (JANUVIA) 50 MG tablet    Sig: Take 1 tablet (50 mg total) by mouth daily.    Dispense:  90 tablet    Refill:  1    This patient was seen by Lynn Ito, PA-C in collaboration with Dr. Beverely Risen as a part of collaborative care agreement.  Total time spent:35 Minutes  Time spent includes review of chart, medications, test results, and follow up plan with the patient.     Lyndon Code, MD  Internal Medicine

## 2022-06-29 LAB — UA/M W/RFLX CULTURE, ROUTINE
Bilirubin, UA: NEGATIVE
Glucose, UA: NEGATIVE
Ketones, UA: NEGATIVE
Leukocytes,UA: NEGATIVE
Nitrite, UA: NEGATIVE
Protein,UA: NEGATIVE
RBC, UA: NEGATIVE
Specific Gravity, UA: 1.014 (ref 1.005–1.030)
Urobilinogen, Ur: 0.2 mg/dL (ref 0.2–1.0)
pH, UA: 7.5 (ref 5.0–7.5)

## 2022-06-29 LAB — CBC WITH DIFFERENTIAL/PLATELET
Basophils Absolute: 0 10*3/uL (ref 0.0–0.2)
Basos: 1 %
EOS (ABSOLUTE): 0 10*3/uL (ref 0.0–0.4)
Eos: 1 %
Hematocrit: 41.4 % (ref 34.0–46.6)
Hemoglobin: 13.8 g/dL (ref 11.1–15.9)
Immature Grans (Abs): 0 10*3/uL (ref 0.0–0.1)
Immature Granulocytes: 0 %
Lymphocytes Absolute: 1.9 10*3/uL (ref 0.7–3.1)
Lymphs: 32 %
MCH: 35.6 pg — ABNORMAL HIGH (ref 26.6–33.0)
MCHC: 33.3 g/dL (ref 31.5–35.7)
MCV: 107 fL — ABNORMAL HIGH (ref 79–97)
Monocytes Absolute: 0.8 10*3/uL (ref 0.1–0.9)
Monocytes: 12 %
Neutrophils Absolute: 3.4 10*3/uL (ref 1.4–7.0)
Neutrophils: 54 %
Platelets: 168 10*3/uL (ref 150–450)
RBC: 3.88 x10E6/uL (ref 3.77–5.28)
RDW: 13.1 % (ref 11.7–15.4)
WBC: 6.1 10*3/uL (ref 3.4–10.8)

## 2022-06-29 LAB — COMPREHENSIVE METABOLIC PANEL
ALT: 36 IU/L — ABNORMAL HIGH (ref 0–32)
AST: 33 IU/L (ref 0–40)
Albumin/Globulin Ratio: 1.2 (ref 1.2–2.2)
Albumin: 4.6 g/dL (ref 3.8–4.8)
Alkaline Phosphatase: 212 IU/L — ABNORMAL HIGH (ref 44–121)
BUN/Creatinine Ratio: 17 (ref 12–28)
BUN: 20 mg/dL (ref 8–27)
Bilirubin Total: 0.7 mg/dL (ref 0.0–1.2)
CO2: 26 mmol/L (ref 20–29)
Calcium: 10.6 mg/dL — ABNORMAL HIGH (ref 8.7–10.3)
Chloride: 94 mmol/L — ABNORMAL LOW (ref 96–106)
Creatinine, Ser: 1.16 mg/dL — ABNORMAL HIGH (ref 0.57–1.00)
Globulin, Total: 3.9 g/dL (ref 1.5–4.5)
Glucose: 136 mg/dL — ABNORMAL HIGH (ref 70–99)
Potassium: 4 mmol/L (ref 3.5–5.2)
Sodium: 137 mmol/L (ref 134–144)
Total Protein: 8.5 g/dL (ref 6.0–8.5)
eGFR: 48 mL/min/{1.73_m2} — ABNORMAL LOW (ref >59)

## 2022-06-29 LAB — B12 AND FOLATE PANEL
Folate: >20 ng/mL (ref >3.0)
Vitamin B-12: >2000 pg/mL — ABNORMAL HIGH (ref 232–1245)

## 2022-06-29 LAB — MICROSCOPIC EXAMINATION
Bacteria, UA: NONE SEEN
Casts: NONE SEEN /lpf

## 2022-06-30 ENCOUNTER — Other Ambulatory Visit: Payer: Self-pay | Admitting: Physician Assistant

## 2022-06-30 DIAGNOSIS — N1831 Chronic kidney disease, stage 3a: Secondary | ICD-10-CM

## 2022-07-03 ENCOUNTER — Other Ambulatory Visit: Payer: Self-pay | Admitting: Physician Assistant

## 2022-07-03 DIAGNOSIS — M064 Inflammatory polyarthropathy: Secondary | ICD-10-CM

## 2022-07-10 ENCOUNTER — Telehealth: Payer: Self-pay | Admitting: Physician Assistant

## 2022-07-10 NOTE — Telephone Encounter (Signed)
Podiatry appointment>> 08/02/2022 @ [facility]

## 2022-07-19 ENCOUNTER — Telehealth: Payer: Self-pay

## 2022-07-19 NOTE — Telephone Encounter (Signed)
-----   Message from Carlean Jews, PA-C sent at 07/17/2022  3:37 PM EDT ----- Please let pt know that overall her labs are still stable

## 2022-07-19 NOTE — Telephone Encounter (Signed)
Spoke with patient regarding lab results. 

## 2022-08-02 ENCOUNTER — Ambulatory Visit (INDEPENDENT_AMBULATORY_CARE_PROVIDER_SITE_OTHER): Payer: 59 | Admitting: Podiatry

## 2022-08-02 ENCOUNTER — Encounter: Payer: Self-pay | Admitting: Podiatry

## 2022-08-02 VITALS — BP 168/81 | HR 76

## 2022-08-02 DIAGNOSIS — M2141 Flat foot [pes planus] (acquired), right foot: Secondary | ICD-10-CM | POA: Diagnosis not present

## 2022-08-02 DIAGNOSIS — M2142 Flat foot [pes planus] (acquired), left foot: Secondary | ICD-10-CM

## 2022-08-02 DIAGNOSIS — B351 Tinea unguium: Secondary | ICD-10-CM

## 2022-08-02 DIAGNOSIS — M79674 Pain in right toe(s): Secondary | ICD-10-CM

## 2022-08-02 DIAGNOSIS — L84 Corns and callosities: Secondary | ICD-10-CM | POA: Diagnosis not present

## 2022-08-02 DIAGNOSIS — M79675 Pain in left toe(s): Secondary | ICD-10-CM | POA: Diagnosis not present

## 2022-08-02 DIAGNOSIS — E119 Type 2 diabetes mellitus without complications: Secondary | ICD-10-CM | POA: Diagnosis not present

## 2022-08-02 NOTE — Progress Notes (Signed)
Subjective: Tracey Love presents today for diabetic foot evaluation. Chief Complaint  Patient presents with   Nail Problem    "Trim my toenails." Dr. Lynn Ito - Jun 28, 2022, glucose - yesterday 200 (?) N - toenails painful L - bilateral D - couple mos. O - gradually worse C - long, dark, sore A - none T - I used to cut myself   Patient relates 20 year h/o diabetes.  Patient denies any h/o foot wounds.  Patient denies any numbness, tingling, burning, or pins/needle sensation in feet.  Risk factors: diabetes, HTN, hyperlipidemia.  PCP is McDonough, Salomon Fick, PA-C.  Past Medical History:  Diagnosis Date   A-fib (HCC)    Diabetes mellitus, type 2 (HCC)    Gout    feet   Hypertension    MGUS (monoclonal gammopathy of unknown significance)    Osteoporosis    Thyroid nodule     Patient Active Problem List   Diagnosis Date Noted   Diabetes mellitus (HCC)    Macrocytosis 12/05/2020   Moderate mitral regurgitation 04/06/2020   Moderate tricuspid regurgitation 04/06/2020   Pulmonary hypertension, moderate to severe (HCC) 04/06/2020   Benign essential HTN 01/12/2019   Hyperlipidemia, mixed 01/12/2019   Paroxysmal A-fib (HCC) 01/12/2019   SOBOE (shortness of breath on exertion) 01/12/2019   MGUS (monoclonal gammopathy of unknown significance) 10/12/2015    Past Surgical History:  Procedure Laterality Date   CATARACT EXTRACTION W/PHACO Right 10/21/2018   Procedure: CATARACT EXTRACTION PHACO AND INTRAOCULAR LENS PLACEMENT (IOC) RIGHT VISION BLUE DIABETIC  08.27.0  24.8%  126.0;  Surgeon: Galen Manila, MD;  Location: [facility];  Service: Ophthalmology;  Laterality: Right;  diabetic - oral meds   CATARACT EXTRACTION W/PHACO Left 11/18/2018   Procedure: CATARACT EXTRACTION PHACO AND INTRAOCULAR LENS PLACEMENT (IOC) LEFT DIABETIC 01:18.5     15.8%    12.45;  Surgeon: Galen Manila, MD;  Location: [facility];  Service: Ophthalmology;   Laterality: Left;  DIABETIC   TUBAL LIGATION  1966    Current Outpatient Medications on File Prior to Visit  Medication Sig Dispense Refill   acetaminophen (TYLENOL) 500 MG tablet Take 1,000 mg by mouth every 8 (eight) hours as needed for mild pain or moderate pain.      aspirin EC 81 MG tablet Take 81 mg by mouth daily.     atorvastatin (LIPITOR) 40 MG tablet TAKE 1 TABLET BY MOUTH EVERY DAY 90 tablet 1   Cholecalciferol (VITAMIN D-3) 1000 UNITS CAPS Take 1,000 Units by mouth daily.     colchicine 0.6 MG tablet TAKE 2 TABS ON THE DAY OF GOUT FLARE, TAKE 1 ADDITIONAL TAB 1 HR AFTER ON THE FIRST DAY. AFTER THE FIRST DAY TAKE 1 TABLET DAILY UNTIL GOUT FLARE RESOLVES. USE AS NEEDED FOR GOUT FLARES. 270 tablet 1   CRANBERRY PO Take 4,200 mg by mouth daily as needed.     ELIQUIS 5 MG TABS tablet TAKE 1 TABLET BY MOUTH TWICE A DAY 60 tablet 3   furosemide (LASIX) 20 MG tablet TAKE 1 TABLET BY MOUTH EVERY DAY FOR 3 DAYS FOR SWELLING, THEN AS NEEDED 90 tablet 1   hydrochlorothiazide (HYDRODIURIL) 25 MG tablet TAKE 1 TABLET BY MOUTH EVERY DAY 90 tablet 1   hydrOXYzine (ATARAX) 10 MG tablet Take 1 tablet (10 mg total) by mouth 3 (three) times daily as needed. 270 tablet 1   Lancets (ONETOUCH DELICA PLUS LANCET33G) MISC USE TO TEST BLOOD SUGAR ONCE DAILY  100 each 3   losartan (COZAAR) 25 MG tablet TAKE 1 TABLET (25 MG TOTAL) BY MOUTH DAILY. 90 tablet 1   meloxicam (MOBIC) 7.5 MG tablet TAKE 1 TABLET (7.5 MG TOTAL) BY MOUTH DAILY. AS NEEDED 30 tablet 2   MYRBETRIQ 25 MG TB24 tablet TAKE 1 TABLET BY MOUTH EVERY DAY 90 tablet 1   omeprazole (PRILOSEC) 40 MG capsule TAKE 1 CAPSULE (40 MG TOTAL) BY MOUTH DAILY. 90 capsule 1   ONETOUCH ULTRA test strip USE TO TEST BLOOD SUGAR DAILY 100 strip 3   Selenium 200 MCG CAPS Take by mouth daily.     sitaGLIPtin (JANUVIA) 50 MG tablet Take 1 tablet (50 mg total) by mouth daily. 90 tablet 1   TURMERIC PO Take 500 mg by mouth daily.     vitamin B-12 (CYANOCOBALAMIN)  500 MCG tablet Take 500 mcg by mouth daily.     vitamin E 100 UNIT capsule Take 100 Units by mouth daily.     No current facility-administered medications on file prior to visit.     Allergies  Allergen Reactions   Gabapentin     Upset stomach   Penicillins     Doesn't remember reaction   Rivaroxaban Other (See Comments)    Patient stated she had a lot of blood gushing in her mouth when she brushed her teeth    Social History   Occupational History   Not on file  Tobacco Use   Smoking status: Never   Smokeless tobacco: Never  Vaping Use   Vaping Use: Never used  Substance and Sexual Activity   Alcohol use: Never   Drug use: Never   Sexual activity: Not on file    Family History  Problem Relation Age of Onset   Asthma Sister    Breast cancer Neg Hx     Immunization History  Administered Date(s) Administered   Influenza Inj Mdck Quad Pf 11/08/2020   Influenza, High Dose Seasonal PF 11/12/2016, 10/25/2017, 11/06/2018, 12/16/2019, 11/18/2021   Influenza-Unspecified 11/06/2018   PFIZER Comirnaty(Gray Top)Covid-19 Tri-Sucrose Vaccine 02/25/2020   PFIZER(Purple Top)SARS-COV-2 Vaccination 05/02/2019, 05/27/2019   PNEUMOCOCCAL CONJUGATE-20 09/15/2021   Pneumococcal Conjugate-13 11/24/2017   Tdap 09/15/2021   Zoster Recombinat (Shingrix) 08/21/2017, 10/19/2017, 10/25/2017, 11/18/2021    Objective: Vitals:   08/02/22 0959  BP: (!) 168/81  Pulse: 76    Tracey Love is a pleasant 82 y.o. female in NAD. AAO X 3.  Vascular Examination: Capillary refill time immediate b/l. Vascular status intact b/l with palpable DP pulses; faintly palpable PT pulses. Pedal hair sparse b/l. No edema. No pain with calf compression b/l. Skin temperature gradient WNL b/l. No cyanosis or clubbing noted b/l. No ischemia or gangrene b/l.   Neurological Examination: Sensation grossly intact b/l with 10 gram monofilament. Vibratory sensation intact b/l.   Dermatological Examination: Pedal  skin with normal turgor, texture and tone b/l.  No open wounds. No interdigital macerations.   Toenails 1-5 b/l thick, discolored, elongated with subungual debris and pain on dorsal palpation.   Hyperkeratotic lesion(s) R hallux.  No erythema, no edema, no drainage, no fluctuance.  Musculoskeletal Examination: Normal muscle strength 5/5 to all lower extremity muscle groups bilaterally. Pes planus deformity noted bilateral LE.Marland Kitchen No pain, crepitus or joint limitation noted with ROM b/l LE.  Patient ambulates independently without assistive aids.  Radiographs: None  Last A1c:      Latest Ref Rng & Units 06/28/2022    9:00 AM 12/14/2021    9:33 AM  Hemoglobin A1C  Hemoglobin-A1c 4.0 - 5.6 % 7.0  6.2    Assessment: 1. Pain due to onychomycosis of toenails of both feet   2. Callus   3. Pes planus of both feet   4. Type 2 diabetes mellitus without complication, without long-term current use of insulin (HCC)   5. Encounter for diabetic foot exam (HCC)      ADA Risk Categorization: Low Risk:  Patient has all of the following: Intact protective sensation No prior foot ulcer  No severe deformity Pedal pulses present  Plan: -Patient was evaluated and treated. All patient's and/or POA's questions/concerns answered on today's visit. -Diabetic foot examination performed today. -Continue diabetic foot care principles: inspect feet daily, monitor glucose as recommended by PCP and/or Endocrinologist, and follow prescribed diet per PCP, Endocrinologist and/or dietician. -Patient to continue soft, supportive shoe gear daily. -Toenails 1-5 b/l were debrided in length and girth with sterile nail nippers and dremel without iatrogenic bleeding.  -Callus(es) right great toe pared utilizing sterile scalpel blade without complication or incident. Total number debrided =1. -Patient/POA to call should there be question/concern in the interim.  Return in about 3 months (around 11/02/2022).  Freddie Breech, DPM

## 2022-08-16 ENCOUNTER — Other Ambulatory Visit: Payer: Self-pay | Admitting: Physician Assistant

## 2022-09-16 ENCOUNTER — Other Ambulatory Visit: Payer: Self-pay | Admitting: Physician Assistant

## 2022-09-16 DIAGNOSIS — D696 Thrombocytopenia, unspecified: Secondary | ICD-10-CM

## 2022-09-16 DIAGNOSIS — D472 Monoclonal gammopathy: Secondary | ICD-10-CM

## 2022-10-14 ENCOUNTER — Other Ambulatory Visit: Payer: Self-pay | Admitting: Physician Assistant

## 2022-10-14 DIAGNOSIS — N1831 Chronic kidney disease, stage 3a: Secondary | ICD-10-CM

## 2022-10-14 DIAGNOSIS — E782 Mixed hyperlipidemia: Secondary | ICD-10-CM

## 2022-10-17 ENCOUNTER — Other Ambulatory Visit: Payer: Self-pay | Admitting: Physician Assistant

## 2022-10-17 DIAGNOSIS — M064 Inflammatory polyarthropathy: Secondary | ICD-10-CM

## 2022-10-18 ENCOUNTER — Telehealth: Payer: Self-pay | Admitting: Physician Assistant

## 2022-10-18 ENCOUNTER — Encounter: Payer: Self-pay | Admitting: Physician Assistant

## 2022-10-18 ENCOUNTER — Ambulatory Visit (INDEPENDENT_AMBULATORY_CARE_PROVIDER_SITE_OTHER): Payer: 59 | Admitting: Physician Assistant

## 2022-10-18 VITALS — BP 130/70 | HR 83 | Temp 98.1°F | Resp 16 | Ht 64.0 in | Wt 182.2 lb

## 2022-10-18 DIAGNOSIS — G8929 Other chronic pain: Secondary | ICD-10-CM | POA: Diagnosis not present

## 2022-10-18 DIAGNOSIS — M25561 Pain in right knee: Secondary | ICD-10-CM

## 2022-10-18 DIAGNOSIS — M8580 Other specified disorders of bone density and structure, unspecified site: Secondary | ICD-10-CM

## 2022-10-18 DIAGNOSIS — I10 Essential (primary) hypertension: Secondary | ICD-10-CM

## 2022-10-18 DIAGNOSIS — M25562 Pain in left knee: Secondary | ICD-10-CM

## 2022-10-18 NOTE — Telephone Encounter (Signed)
Orthopedic referral sent via [facility] to [facility]. Notified patient. Gave pt telephone # [phone]

## 2022-10-18 NOTE — Telephone Encounter (Signed)
Awaiting 10/18/22 office notes for Orthopedic referral-[facility]

## 2022-10-18 NOTE — Progress Notes (Signed)
[facility] [address] [place], [place] [address]  Internal MEDICINE  Office Visit Note  Patient Name: Tracey Love  [id number]  [id number]  Date of Service: 10/18/2022  Chief Complaint  Patient presents with   Follow-up   Diabetes   Hypertension    HPI Pt is here for routine follow up -BG 128 yesterday -continued left knee pain, hx of OA would like to retry injections with ortho -States arthritis is main concern, but has been told to limit mobic/NSAIDs due to kidneys. May try alternating tylenol, but injections may help without need for daily medication -Has been 5 years since last bone density which did have some evidence of osteopenia and will check this. She has seen commercials for Evenity for bones and thought this could help arthritis, discussed it is a treatment for osteoporosis  Current Medication: Outpatient Encounter Medications as of 10/18/2022  Medication Sig   acetaminophen (TYLENOL) 500 MG tablet Take 1,000 mg by mouth every 8 (eight) hours as needed for mild pain or moderate pain.    aspirin EC 81 MG tablet Take 81 mg by mouth daily.   atorvastatin (LIPITOR) 40 MG tablet TAKE 1 TABLET BY MOUTH EVERY DAY   Cholecalciferol (VITAMIN D-3) 1000 UNITS CAPS Take 1,000 Units by mouth daily.   colchicine 0.6 MG tablet TAKE 2 TABS ON THE DAY OF GOUT FLARE, TAKE 1 ADDITIONAL TAB 1 HR AFTER ON THE FIRST DAY. AFTER THE FIRST DAY TAKE 1 TABLET DAILY UNTIL GOUT FLARE RESOLVES. USE AS NEEDED FOR GOUT FLARES.   CRANBERRY PO Take 4,200 mg by mouth daily as needed.   ELIQUIS 5 MG TABS tablet TAKE 1 TABLET BY MOUTH TWICE A DAY   furosemide (LASIX) 20 MG tablet TAKE 1 TABLET BY MOUTH EVERY DAY FOR 3 DAYS FOR SWELLING, THEN AS NEEDED   hydrochlorothiazide (HYDRODIURIL) 25 MG tablet TAKE 1 TABLET BY MOUTH EVERY DAY   hydrOXYzine (ATARAX) 10 MG tablet Take 1 tablet (10 mg total) by mouth 3 (three) times daily as needed.   JANUVIA 50 MG tablet TAKE 1 TABLET BY MOUTH EVERY  DAY   Lancets (ONETOUCH DELICA PLUS LANCET33G) MISC USE TO TEST BLOOD SUGAR ONCE DAILY   losartan (COZAAR) 25 MG tablet TAKE 1 TABLET (25 MG TOTAL) BY MOUTH DAILY.   meloxicam (MOBIC) 7.5 MG tablet TAKE 1 TABLET (7.5 MG TOTAL) BY MOUTH DAILY. AS NEEDED   MYRBETRIQ 25 MG TB24 tablet TAKE 1 TABLET BY MOUTH EVERY DAY   omeprazole (PRILOSEC) 40 MG capsule TAKE 1 CAPSULE (40 MG TOTAL) BY MOUTH DAILY.   ONETOUCH ULTRA test strip USE TO TEST BLOOD SUGAR DAILY   Selenium 200 MCG CAPS Take by mouth daily.   TURMERIC PO Take 500 mg by mouth daily.   vitamin B-12 (CYANOCOBALAMIN) 500 MCG tablet Take 500 mcg by mouth daily.   vitamin E 100 UNIT capsule Take 100 Units by mouth daily.   No facility-administered encounter medications on file as of 10/18/2022.    Surgical History: Past Surgical History:  Procedure Laterality Date   CATARACT EXTRACTION W/PHACO Right 10/21/2018   Procedure: CATARACT EXTRACTION PHACO AND INTRAOCULAR LENS PLACEMENT (IOC) RIGHT VISION BLUE DIABETIC  08.27.0  24.8%  126.0;  Surgeon: Galen Manila, MD;  Location: [facility];  Service: Ophthalmology;  Laterality: Right;  diabetic - oral meds   CATARACT EXTRACTION W/PHACO Left 11/18/2018   Procedure: CATARACT EXTRACTION PHACO AND INTRAOCULAR LENS PLACEMENT (IOC) LEFT DIABETIC 01:18.5     15.8%  12.45;  Surgeon: Galen Manila, MD;  Location: [facility];  Service: Ophthalmology;  Laterality: Left;  DIABETIC   TUBAL LIGATION  1966    Medical History: Past Medical History:  Diagnosis Date   A-fib (HCC)    Diabetes mellitus, type 2 (HCC)    Gout    feet   Hypertension    MGUS (monoclonal gammopathy of unknown significance)    Osteoporosis    Thyroid nodule     Family History: Family History  Problem Relation Age of Onset   Asthma Sister    Breast cancer Neg Hx     Social History   Socioeconomic History   Marital status: Divorced    Spouse name: Not on file   Number of children: Not on file    Years of education: Not on file   Highest education level: Not on file  Occupational History   Not on file  Tobacco Use   Smoking status: Never   Smokeless tobacco: Never  Vaping Use   Vaping status: Never Used  Substance and Sexual Activity   Alcohol use: Never   Drug use: Never   Sexual activity: Not on file  Other Topics Concern   Not on file  Social History Narrative   Not on file   Social Determinants of Health   Financial Resource Strain: Not on file  Food Insecurity: Not on file  Transportation Needs: Not on file  Physical Activity: Not on file  Stress: Not on file  Social Connections: Not on file  Intimate Partner Violence: Not on file      Review of Systems  Constitutional:  Negative for chills, fatigue and unexpected weight change.  HENT:  Negative for congestion, rhinorrhea, sneezing and sore throat.   Eyes:  Negative for redness.  Respiratory:  Negative for cough, chest tightness, shortness of breath and wheezing.   Cardiovascular:  Negative for chest pain and palpitations.  Gastrointestinal:  Negative for abdominal pain, constipation, diarrhea, nausea and vomiting.  Genitourinary:  Negative for dysuria and frequency.  Musculoskeletal:  Positive for arthralgias. Negative for back pain, joint swelling and neck pain.  Skin:  Negative for rash.  Neurological: Negative.  Negative for dizziness, tremors, light-headedness and numbness.  Hematological:  Negative for adenopathy. Does not bruise/bleed easily.  Psychiatric/Behavioral:  Negative for behavioral problems (Depression), sleep disturbance and suicidal ideas. The patient is not nervous/anxious.     Vital Signs: BP 130/70   Pulse 83   Temp 98.1 F (36.7 C)   Resp 16   Ht 5\' 4"  (1.626 m)   Wt 182 lb 3.2 oz (82.6 kg)   SpO2 98%   BMI 31.27 kg/m    Physical Exam Vitals and nursing note reviewed.  Constitutional:      General: She is not in acute distress.    Appearance: Normal appearance. She  is normal weight. She is not ill-appearing.  HENT:     Head: Normocephalic and atraumatic.  Eyes:     Extraocular Movements: Extraocular movements intact.     Pupils: Pupils are equal, round, and reactive to light.  Cardiovascular:     Rate and Rhythm: Normal rate and regular rhythm.  Pulmonary:     Effort: Pulmonary effort is normal. No respiratory distress.  Skin:    General: Skin is warm and dry.  Neurological:     Mental Status: She is alert and oriented to person, place, and time.     Cranial Nerves: No cranial nerve deficit.  Coordination: Coordination normal.     Gait: Gait abnormal.  Psychiatric:        Mood and Affect: Mood normal.        Behavior: Behavior normal.        Assessment/Plan: 1. Chronic pain of both knees Will refer back to ortho - Ambulatory referral to Orthopedic Surgery  2. Osteopenia, unspecified location - DG Bone Density; Future  3. Essential hypertension Well controlled, continue current medication   General Counseling: Kea verbalizes understanding of the findings of todays visit and agrees with plan of treatment. I have discussed any further diagnostic evaluation that may be needed or ordered today. We also reviewed her medications today. she has been encouraged to call the office with any questions or concerns that should arise related to todays visit.    Orders Placed This Encounter  Procedures   DG Bone Density   Ambulatory referral to Orthopedic Surgery    No orders of the defined types were placed in this encounter.   This patient was seen by Lynn Ito, PA-C in collaboration with Dr. Beverely Risen as a part of collaborative care agreement.   Total time spent:30 Minutes Time spent includes review of chart, medications, test results, and follow up plan with the patient.      Dr Lyndon Code Internal medicine

## 2022-11-01 ENCOUNTER — Ambulatory Visit: Payer: 59 | Admitting: Physician Assistant

## 2022-11-09 ENCOUNTER — Ambulatory Visit (INDEPENDENT_AMBULATORY_CARE_PROVIDER_SITE_OTHER): Payer: 59 | Admitting: Podiatry

## 2022-11-09 ENCOUNTER — Encounter: Payer: Self-pay | Admitting: Podiatry

## 2022-11-09 DIAGNOSIS — M79675 Pain in left toe(s): Secondary | ICD-10-CM | POA: Diagnosis not present

## 2022-11-09 DIAGNOSIS — B351 Tinea unguium: Secondary | ICD-10-CM | POA: Diagnosis not present

## 2022-11-09 DIAGNOSIS — E119 Type 2 diabetes mellitus without complications: Secondary | ICD-10-CM | POA: Diagnosis not present

## 2022-11-09 DIAGNOSIS — L84 Corns and callosities: Secondary | ICD-10-CM

## 2022-11-09 DIAGNOSIS — M79674 Pain in right toe(s): Secondary | ICD-10-CM | POA: Diagnosis not present

## 2022-11-13 NOTE — Progress Notes (Signed)
Subjective:  Patient ID: ISSABELA ARCHACKI, female    DOB: [birth year],  MRN: [id number]  82 y.o. female presents preventative diabetic foot care and callus(es) right foot and painful thick toenails that are difficult to trim. Painful toenails interfere with ambulation. Aggravating factors include wearing enclosed shoe gear. Pain is relieved with periodic professional debridement. Painful calluses are aggravated when weightbearing with and without shoegear. Pain is relieved with periodic professional debridement.  Chief Complaint  Patient presents with   Nail Problem    DFC,Referring Provider McDonough, Salomon Fick, PA-C,lov:08/24,A1C:7.0,BS:unknown today      New problem(s): None   Allergies  Allergen Reactions   Gabapentin     Upset stomach   Penicillins     Doesn't remember reaction   Rivaroxaban Other (See Comments)    Patient stated she had a lot of blood gushing in her mouth when she brushed her teeth    Review of Systems: Negative except as noted in the HPI.   Objective:  ZELAYA KULAS is a pleasant 82 y.o. female in NAD. AAO x 3.  Vascular Examination: Vascular status intact b/l with palpable pedal pulses. CFT immediate b/l. Pedal hair present. No edema. No pain with calf compression b/l. Skin temperature gradient WNL b/l. No varicosities noted. No cyanosis or clubbing noted.  Neurological Examination: Sensation grossly intact b/l with 10 gram monofilament. Vibratory sensation intact b/l.  Dermatological Examination: Pedal skin with normal turgor, texture and tone b/l. No open wounds nor interdigital macerations noted. Toenails 1-5 b/l thick, discolored, elongated with subungual debris and pain on dorsal palpation. Hyperkeratotic lesion(s) right great toe.  No erythema, no edema, no drainage, no fluctuance.  Musculoskeletal Examination: Muscle strength 5/5 to b/l LE.  No pain, crepitus noted b/l. No gross pedal deformities. Patient ambulates independently without assistive  aids.   Radiographs: None  Last A1c:      Latest Ref Rng & Units 06/28/2022    9:00 AM 12/14/2021    9:33 AM  Hemoglobin A1C  Hemoglobin-A1c 4.0 - 5.6 % 7.0  6.2      Assessment:   1. Pain due to onychomycosis of toenails of both feet   2. Callus   3. Type 2 diabetes mellitus without complication, without long-term current use of insulin (HCC)    Plan:  -Consent given for treatment as described below: -Examined patient. -Continue supportive shoe gear daily. -Mycotic toenails 1-5 bilaterally were debrided in length and girth with sterile nail nippers and dremel without incident. -Callus(es) right great toe pared utilizing sterile scalpel blade without complication or incident. Total number debrided =1. -Patient/POA to call should there be question/concern in the interim.  Return in about 3 months (around 02/08/2023).  Freddie Breech, DPM

## 2022-12-12 DIAGNOSIS — M170 Bilateral primary osteoarthritis of knee: Secondary | ICD-10-CM | POA: Diagnosis not present

## 2022-12-13 ENCOUNTER — Encounter: Payer: Self-pay | Admitting: Physician Assistant

## 2022-12-13 ENCOUNTER — Ambulatory Visit (INDEPENDENT_AMBULATORY_CARE_PROVIDER_SITE_OTHER): Payer: 59 | Admitting: Physician Assistant

## 2022-12-13 VITALS — BP 110/82 | HR 88 | Temp 98.3°F | Resp 16 | Ht 64.0 in | Wt 183.0 lb

## 2022-12-13 DIAGNOSIS — E1122 Type 2 diabetes mellitus with diabetic chronic kidney disease: Secondary | ICD-10-CM | POA: Diagnosis not present

## 2022-12-13 DIAGNOSIS — N1831 Chronic kidney disease, stage 3a: Secondary | ICD-10-CM

## 2022-12-13 LAB — POCT GLYCOSYLATED HEMOGLOBIN (HGB A1C): Hemoglobin A1C: 7.1 % — AB (ref 4.0–5.6)

## 2022-12-13 NOTE — Progress Notes (Signed)
[facility] [address] [place], [place] [address]  Internal MEDICINE  Office Visit Note  Patient Name: Tracey Love  [id number]  [id number]  Date of Service: 12/19/2022  Chief Complaint  Patient presents with   Follow-up   Diabetes   Hypertension    HPI Pt is here for routine follow up -Ortho gave a shot in her left knee, sugar was high this morning after shot yesterday -will double [place] for now due to high sugars, then may decrease back down -will keep monitoring kidney function, may need [place] -bone density scheduled  Current Medication: Outpatient Encounter Medications as of 12/13/2022  Medication Sig   acetaminophen (TYLENOL) 500 MG tablet Take 1,000 mg by mouth every 8 (eight) hours as needed for mild pain or moderate pain.    aspirin EC 81 MG tablet Take 81 mg by mouth daily.   atorvastatin (LIPITOR) 40 MG tablet TAKE 1 TABLET BY MOUTH EVERY DAY   Cholecalciferol (VITAMIN D-3) 1000 UNITS CAPS Take 1,000 Units by mouth daily.   colchicine 0.6 MG tablet TAKE 2 TABS ON THE DAY OF GOUT FLARE, TAKE 1 ADDITIONAL TAB 1 HR AFTER ON THE FIRST DAY. AFTER THE FIRST DAY TAKE 1 TABLET DAILY UNTIL GOUT FLARE RESOLVES. USE AS NEEDED FOR GOUT FLARES.   CRANBERRY PO Take 4,200 mg by mouth daily as needed.   hydrochlorothiazide (HYDRODIURIL) 25 MG tablet TAKE 1 TABLET BY MOUTH EVERY DAY   hydrOXYzine (ATARAX) 10 MG tablet Take 1 tablet (10 mg total) by mouth 3 (three) times daily as needed.   JANUVIA 50 MG tablet TAKE 1 TABLET BY MOUTH EVERY DAY   Lancets (ONETOUCH DELICA PLUS LANCET33G) MISC USE TO TEST BLOOD SUGAR ONCE DAILY   losartan (COZAAR) 25 MG tablet TAKE 1 TABLET (25 MG TOTAL) BY MOUTH DAILY.   meloxicam (MOBIC) 7.5 MG tablet TAKE 1 TABLET (7.5 MG TOTAL) BY MOUTH DAILY. AS NEEDED   MYRBETRIQ 25 MG TB24 tablet TAKE 1 TABLET BY MOUTH EVERY DAY   omeprazole (PRILOSEC) 40 MG capsule TAKE 1 CAPSULE (40 MG TOTAL) BY MOUTH DAILY.   ONETOUCH ULTRA test strip USE TO TEST  BLOOD SUGAR DAILY   Selenium 200 MCG CAPS Take by mouth daily.   TURMERIC PO Take 500 mg by mouth daily.   vitamin B-12 (CYANOCOBALAMIN) 500 MCG tablet Take 500 mcg by mouth daily.   vitamin E 100 UNIT capsule Take 100 Units by mouth daily.   [DISCONTINUED] ELIQUIS 5 MG TABS tablet TAKE 1 TABLET BY MOUTH TWICE A DAY   [DISCONTINUED] furosemide (LASIX) 20 MG tablet TAKE 1 TABLET BY MOUTH EVERY DAY FOR 3 DAYS FOR SWELLING, THEN AS NEEDED   No facility-administered encounter medications on file as of 12/13/2022.    Surgical History: Past Surgical History:  Procedure Laterality Date   CATARACT EXTRACTION W/PHACO Right 10/21/2018   Procedure: CATARACT EXTRACTION PHACO AND INTRAOCULAR LENS PLACEMENT (IOC) RIGHT VISION BLUE DIABETIC  08.27.0  24.8%  126.0;  Surgeon: Galen Manila, MD;  Location: [facility];  Service: Ophthalmology;  Laterality: Right;  diabetic - oral meds   CATARACT EXTRACTION W/PHACO Left 11/18/2018   Procedure: CATARACT EXTRACTION PHACO AND INTRAOCULAR LENS PLACEMENT (IOC) LEFT DIABETIC 01:18.5     15.8%    12.45;  Surgeon: Galen Manila, MD;  Location: [facility];  Service: Ophthalmology;  Laterality: Left;  DIABETIC   TUBAL LIGATION  1966    Medical History: Past Medical History:  Diagnosis Date   A-fib (  HCC)    Diabetes mellitus, type 2 (HCC)    Gout    feet   Hypertension    MGUS (monoclonal gammopathy of unknown significance)    Osteoporosis    Thyroid nodule     Family History: Family History  Problem Relation Age of Onset   Asthma Sister    Breast cancer Neg Hx     Social History   Socioeconomic History   Marital status: Divorced    Spouse name: Not on file   Number of children: Not on file   Years of education: Not on file   Highest education level: Not on file  Occupational History   Not on file  Tobacco Use   Smoking status: Never   Smokeless tobacco: Never  Vaping Use   Vaping status: Never Used  Substance and  Sexual Activity   Alcohol use: Never   Drug use: Never   Sexual activity: Not on file  Other Topics Concern   Not on file  Social History Narrative   Not on file   Social Determinants of Health   Financial Resource Strain: Not on file  Food Insecurity: Not on file  Transportation Needs: Not on file  Physical Activity: Not on file  Stress: Not on file  Social Connections: Not on file  Intimate Partner Violence: Not on file      Review of Systems  Constitutional:  Negative for chills, fatigue and unexpected weight change.  HENT:  Negative for congestion, rhinorrhea, sneezing and sore throat.   Eyes:  Negative for redness.  Respiratory:  Negative for cough, chest tightness, shortness of breath and wheezing.   Cardiovascular:  Negative for chest pain and palpitations.  Gastrointestinal:  Negative for abdominal pain, constipation, diarrhea, nausea and vomiting.  Genitourinary:  Negative for dysuria and frequency.  Musculoskeletal:  Positive for arthralgias. Negative for back pain, joint swelling and neck pain.  Skin:  Negative for rash.  Neurological: Negative.  Negative for dizziness, tremors, light-headedness and numbness.  Hematological:  Negative for adenopathy. Does not bruise/bleed easily.  Psychiatric/Behavioral:  Negative for behavioral problems (Depression), sleep disturbance and suicidal ideas. The patient is not nervous/anxious.     Vital Signs: BP 110/82   Pulse 88   Temp 98.3 F (36.8 C)   Resp 16   Ht 5\' 4"  (1.626 m)   Wt 183 lb (83 kg)   SpO2 96%   BMI 31.41 kg/m    Physical Exam Vitals and nursing note reviewed.  Constitutional:      General: She is not in acute distress.    Appearance: Normal appearance. She is normal weight. She is not ill-appearing.  HENT:     Head: Normocephalic and atraumatic.  Eyes:     Extraocular Movements: Extraocular movements intact.     Pupils: Pupils are equal, round, and reactive to light.  Cardiovascular:     Rate  and Rhythm: Normal rate and regular rhythm.  Pulmonary:     Effort: Pulmonary effort is normal. No respiratory distress.  Skin:    General: Skin is warm and dry.  Neurological:     Mental Status: She is alert and oriented to person, place, and time.     Cranial Nerves: No cranial nerve deficit.     Coordination: Coordination normal.     Gait: Gait abnormal.  Psychiatric:        Mood and Affect: Mood normal.        Behavior: Behavior normal.  Assessment/Plan: 1. Type 2 diabetes mellitus with stage 3a chronic kidney disease, without long-term current use of insulin (HCC) - POCT HgB A1C is 7.1, will double [place] for at least a few days while sugars high from prednisone. May be able to drop back down - Urine Microalbumin w/creat. ratio - Comprehensive metabolic panel  2. Stage 3a chronic kidney disease (HCC) - Comprehensive metabolic panel   General Counseling: Filippa verbalizes understanding of the findings of todays visit and agrees with plan of treatment. I have discussed any further diagnostic evaluation that may be needed or ordered today. We also reviewed her medications today. she has been encouraged to call the office with any questions or concerns that should arise related to todays visit.    Orders Placed This Encounter  Procedures   Urine Microalbumin w/creat. ratio   Comprehensive metabolic panel   POCT HgB A1C    No orders of the defined types were placed in this encounter.   This patient was seen by Lynn Ito, PA-C in collaboration with Dr. Beverely Risen as a part of collaborative care agreement.   Total time spent:30 Minutes Time spent includes review of chart, medications, test results, and follow up plan with the patient.      Dr Lyndon Code Internal medicine

## 2022-12-14 LAB — MICROALBUMIN / CREATININE URINE RATIO
Creatinine, Urine: 58.7 mg/dL
Microalb/Creat Ratio: 114 mg/g{creat} — ABNORMAL HIGH (ref 0–29)
Microalbumin, Urine: 67.1 ug/mL

## 2022-12-16 ENCOUNTER — Other Ambulatory Visit: Payer: Self-pay | Admitting: Physician Assistant

## 2022-12-16 DIAGNOSIS — R600 Localized edema: Secondary | ICD-10-CM

## 2022-12-16 DIAGNOSIS — E1122 Type 2 diabetes mellitus with diabetic chronic kidney disease: Secondary | ICD-10-CM

## 2022-12-17 ENCOUNTER — Other Ambulatory Visit: Payer: Self-pay

## 2022-12-17 DIAGNOSIS — R600 Localized edema: Secondary | ICD-10-CM

## 2022-12-17 MED ORDER — FUROSEMIDE 20 MG PO TABS: ORAL_TABLET | ORAL | 1 refills | Status: DC

## 2022-12-17 MED ORDER — APIXABAN 5 MG PO TABS: 5.0000 mg | ORAL_TABLET | Freq: Two times a day (BID) | ORAL | 3 refills | Status: DC

## 2022-12-21 DIAGNOSIS — E1122 Type 2 diabetes mellitus with diabetic chronic kidney disease: Secondary | ICD-10-CM | POA: Diagnosis not present

## 2022-12-21 DIAGNOSIS — N1831 Chronic kidney disease, stage 3a: Secondary | ICD-10-CM | POA: Diagnosis not present

## 2022-12-22 LAB — COMPREHENSIVE METABOLIC PANEL
ALT: 40 [IU]/L — ABNORMAL HIGH (ref 0–32)
AST: 26 [IU]/L (ref 0–40)
Albumin: 4.6 g/dL (ref 3.7–4.7)
Alkaline Phosphatase: 124 [IU]/L — ABNORMAL HIGH (ref 44–121)
BUN/Creatinine Ratio: 18 (ref 12–28)
BUN: 23 mg/dL (ref 8–27)
Bilirubin Total: 0.5 mg/dL (ref 0.0–1.2)
CO2: 26 mmol/L (ref 20–29)
Calcium: 10.7 mg/dL — ABNORMAL HIGH (ref 8.7–10.3)
Chloride: 97 mmol/L (ref 96–106)
Creatinine, Ser: 1.28 mg/dL — ABNORMAL HIGH (ref 0.57–1.00)
Globulin, Total: 3.3 g/dL (ref 1.5–4.5)
Glucose: 155 mg/dL — ABNORMAL HIGH (ref 70–99)
Potassium: 4.4 mmol/L (ref 3.5–5.2)
Sodium: 139 mmol/L (ref 134–144)
Total Protein: 7.9 g/dL (ref 6.0–8.5)
eGFR: 42 mL/min/{1.73_m2} — ABNORMAL LOW (ref >59)

## 2023-01-03 ENCOUNTER — Other Ambulatory Visit: Payer: Medicare Other

## 2023-01-10 ENCOUNTER — Ambulatory Visit (INDEPENDENT_AMBULATORY_CARE_PROVIDER_SITE_OTHER): Payer: 59 | Admitting: Physician Assistant

## 2023-01-10 ENCOUNTER — Encounter: Payer: Self-pay | Admitting: Physician Assistant

## 2023-01-10 ENCOUNTER — Telehealth: Payer: Self-pay | Admitting: Physician Assistant

## 2023-01-10 VITALS — BP 110/70 | HR 80 | Temp 97.8°F | Resp 16 | Ht 64.0 in | Wt 183.8 lb

## 2023-01-10 DIAGNOSIS — R7989 Other specified abnormal findings of blood chemistry: Secondary | ICD-10-CM | POA: Diagnosis not present

## 2023-01-10 DIAGNOSIS — N1831 Chronic kidney disease, stage 3a: Secondary | ICD-10-CM | POA: Diagnosis not present

## 2023-01-10 NOTE — Telephone Encounter (Signed)
Patient notified me today that orthopedic referral sent out in August was denied by [facility] due to not in network with insurance. Orthopedic referral sent via [facility] to [facility]

## 2023-01-10 NOTE — Progress Notes (Signed)
[facility] [address] [place], [place] [address]  Internal MEDICINE  Office Visit Note  Patient Name: Tracey Love  [id number]  [id number]  Date of Service: 01/10/2023  Chief Complaint  Patient presents with   Follow-up    Review lab results   Diabetes   Hypertension    HPI Pt is here for routine follow up to review labs -bone density was rescheduled to Feb -Lost her sister recently, she was on dialysis and pt states she just went downhill with her health complications. Pt is managing ok -Labs reviewed with worsening kidney function, elevated microalbuin as well. Also has elevated alk phos and LFT--will get [place] of abdomen to check kidneys and liver, while also referring to nephrology -Sugar 129 since taking 2 tabs januvia and may be able to decrease again if dropping  Current Medication: Outpatient Encounter Medications as of 01/10/2023  Medication Sig   acetaminophen (TYLENOL) 500 MG tablet Take 1,000 mg by mouth every 8 (eight) hours as needed for mild pain or moderate pain.    apixaban (ELIQUIS) 5 MG TABS tablet Take 1 tablet (5 mg total) by mouth 2 (two) times daily.   aspirin EC 81 MG tablet Take 81 mg by mouth daily.   atorvastatin (LIPITOR) 40 MG tablet TAKE 1 TABLET BY MOUTH EVERY DAY   Cholecalciferol (VITAMIN D-3) 1000 UNITS CAPS Take 1,000 Units by mouth daily.   colchicine 0.6 MG tablet TAKE 2 TABS ON THE DAY OF GOUT FLARE, TAKE 1 ADDITIONAL TAB 1 HR AFTER ON THE FIRST DAY. AFTER THE FIRST DAY TAKE 1 TABLET DAILY UNTIL GOUT FLARE RESOLVES. USE AS NEEDED FOR GOUT FLARES.   CRANBERRY PO Take 4,200 mg by mouth daily as needed.   furosemide (LASIX) 20 MG tablet TAKE 1 TABLET BY MOUTH EVERY DAY FOR 3 DAYS FOR SWELLING, THEN AS NEEDED   hydrochlorothiazide (HYDRODIURIL) 25 MG tablet TAKE 1 TABLET BY MOUTH EVERY DAY   hydrOXYzine (ATARAX) 10 MG tablet Take 1 tablet (10 mg total) by mouth 3 (three) times daily as needed.   JANUVIA 50 MG tablet TAKE 1 TABLET  BY MOUTH EVERY DAY   Lancets (ONETOUCH DELICA PLUS LANCET33G) MISC USE TO TEST BLOOD SUGAR ONCE DAILY   losartan (COZAAR) 25 MG tablet TAKE 1 TABLET (25 MG TOTAL) BY MOUTH DAILY.   meloxicam (MOBIC) 7.5 MG tablet TAKE 1 TABLET (7.5 MG TOTAL) BY MOUTH DAILY. AS NEEDED   MYRBETRIQ 25 MG TB24 tablet TAKE 1 TABLET BY MOUTH EVERY DAY   omeprazole (PRILOSEC) 40 MG capsule TAKE 1 CAPSULE (40 MG TOTAL) BY MOUTH DAILY.   ONETOUCH ULTRA test strip USE TO TEST BLOOD SUGAR DAILY   Selenium 200 MCG CAPS Take by mouth daily.   TURMERIC PO Take 500 mg by mouth daily.   vitamin B-12 (CYANOCOBALAMIN) 500 MCG tablet Take 500 mcg by mouth daily.   vitamin E 100 UNIT capsule Take 100 Units by mouth daily.   No facility-administered encounter medications on file as of 01/10/2023.    Surgical History: Past Surgical History:  Procedure Laterality Date   CATARACT EXTRACTION W/PHACO Right 10/21/2018   Procedure: CATARACT EXTRACTION PHACO AND INTRAOCULAR LENS PLACEMENT (IOC) RIGHT VISION BLUE DIABETIC  08.27.0  24.8%  126.0;  Surgeon: Galen Manila, MD;  Location: [facility];  Service: Ophthalmology;  Laterality: Right;  diabetic - oral meds   CATARACT EXTRACTION W/PHACO Left 11/18/2018   Procedure: CATARACT EXTRACTION PHACO AND INTRAOCULAR LENS PLACEMENT (IOC) LEFT DIABETIC 01:18.5  15.8%    12.45;  Surgeon: Galen Manila, MD;  Location: [facility];  Service: Ophthalmology;  Laterality: Left;  DIABETIC   TUBAL LIGATION  1966    Medical History: Past Medical History:  Diagnosis Date   A-fib (HCC)    Diabetes mellitus, type 2 (HCC)    Gout    feet   Hypertension    MGUS (monoclonal gammopathy of unknown significance)    Osteoporosis    Thyroid nodule     Family History: Family History  Problem Relation Age of Onset   Asthma Sister    Breast cancer Neg Hx     Social History   Socioeconomic History   Marital status: Divorced    Spouse name: Not on file   Number of  children: Not on file   Years of education: Not on file   Highest education level: Not on file  Occupational History   Not on file  Tobacco Use   Smoking status: Never   Smokeless tobacco: Never  Vaping Use   Vaping status: Never Used  Substance and Sexual Activity   Alcohol use: Never   Drug use: Never   Sexual activity: Not on file  Other Topics Concern   Not on file  Social History Narrative   Not on file   Social Determinants of Health   Financial Resource Strain: Not on file  Food Insecurity: Not on file  Transportation Needs: Not on file  Physical Activity: Not on file  Stress: Not on file  Social Connections: Not on file  Intimate Partner Violence: Not on file      Review of Systems  Constitutional:  Negative for chills, fatigue and unexpected weight change.  HENT:  Negative for congestion, rhinorrhea, sneezing and sore throat.   Eyes:  Negative for redness.  Respiratory:  Negative for cough, chest tightness, shortness of breath and wheezing.   Cardiovascular:  Negative for chest pain and palpitations.  Gastrointestinal:  Negative for abdominal pain, constipation, diarrhea, nausea and vomiting.  Genitourinary:  Negative for dysuria and frequency.  Musculoskeletal:  Positive for arthralgias. Negative for back pain, joint swelling and neck pain.  Skin:  Negative for rash.  Neurological: Negative.  Negative for dizziness, tremors, light-headedness and numbness.  Hematological:  Negative for adenopathy. Does not bruise/bleed easily.  Psychiatric/Behavioral:  Negative for behavioral problems (Depression), sleep disturbance and suicidal ideas. The patient is not nervous/anxious.     Vital Signs: BP 110/70   Pulse 80   Temp 97.8 F (36.6 C)   Resp 16   Ht 5\' 4"  (1.626 m)   Wt 183 lb 12.8 oz (83.4 kg)   SpO2 97%   BMI 31.55 kg/m    Physical Exam Vitals and nursing note reviewed.  Constitutional:      General: She is not in acute distress.    Appearance:  Normal appearance. She is normal weight. She is not ill-appearing.  HENT:     Head: Normocephalic and atraumatic.  Eyes:     Extraocular Movements: Extraocular movements intact.     Pupils: Pupils are equal, round, and reactive to light.  Cardiovascular:     Rate and Rhythm: Normal rate and regular rhythm.  Pulmonary:     Effort: Pulmonary effort is normal. No respiratory distress.  Skin:    General: Skin is warm and dry.  Neurological:     Mental Status: She is alert and oriented to person, place, and time.     Cranial Nerves: No cranial  nerve deficit.     Coordination: Coordination normal.     Gait: Gait abnormal.  Psychiatric:        Mood and Affect: Mood normal.        Behavior: Behavior normal.        Assessment/Plan: 1. Stage 3a chronic kidney disease (HCC) Will get [place] and refer to nephrology - US Abdomen Complete; Future - Ambulatory referral to Nephrology  2. Hypercalcemia - Ambulatory referral to Nephrology  3. Elevated LFTs - US Abdomen Complete; Future   General Counseling: aliece vallejos understanding of the findings of todays visit and agrees with plan of treatment. I have discussed any further diagnostic evaluation that may be needed or ordered today. We also reviewed her medications today. she has been encouraged to call the office with any questions or concerns that should arise related to todays visit.    Orders Placed This Encounter  Procedures   US Abdomen Complete   Ambulatory referral to Nephrology    No orders of the defined types were placed in this encounter.   This patient was seen by Lynn Ito, PA-C in collaboration with Dr. Beverely Risen as a part of collaborative care agreement.   Total time spent:30 Minutes Time spent includes review of chart, medications, test results, and follow up plan with the patient.      Dr Lyndon Code Internal medicine

## 2023-01-14 ENCOUNTER — Telehealth: Payer: Self-pay | Admitting: Physician Assistant

## 2023-01-14 ENCOUNTER — Other Ambulatory Visit: Payer: Self-pay | Admitting: Physician Assistant

## 2023-01-14 DIAGNOSIS — M064 Inflammatory polyarthropathy: Secondary | ICD-10-CM

## 2023-01-14 NOTE — Telephone Encounter (Signed)
Nephrology referral faxed to [facility] location; [phone]. Notified patient. Gave pt telephone [phone]

## 2023-01-16 ENCOUNTER — Other Ambulatory Visit: Payer: Self-pay | Admitting: Physician Assistant

## 2023-01-30 ENCOUNTER — Telehealth: Payer: Self-pay

## 2023-01-30 NOTE — Telephone Encounter (Signed)
[facility] Med Adherence Report: Spoke with patient about their Atorvastatin 40mg . Patient is taking prescribed amount as directed. Stressed the importance of medication adherence to the patient. Patient confirmed that they have enough and has just picked up their refill.

## 2023-02-04 ENCOUNTER — Telehealth: Payer: Self-pay | Admitting: Physician Assistant

## 2023-02-04 NOTE — Telephone Encounter (Signed)
DRI or me unable to reach patient to schedule u/s. I spoke her daughter, she will get in touch with patient and have her DRI. She confirmed tele # we have for her is correct-Toni

## 2023-02-04 NOTE — Telephone Encounter (Signed)
Orthopedic appointment 02/26/2023 @ [facility]-Toni

## 2023-02-07 ENCOUNTER — Ambulatory Visit: Payer: 59 | Admitting: Physician Assistant

## 2023-02-08 ENCOUNTER — Ambulatory Visit: Payer: 59 | Admitting: Podiatry

## 2023-02-14 ENCOUNTER — Other Ambulatory Visit: Payer: Self-pay | Admitting: Physician Assistant

## 2023-02-22 ENCOUNTER — Telehealth: Payer: Self-pay | Admitting: Physician Assistant

## 2023-02-22 NOTE — Telephone Encounter (Signed)
 Unable to reach patient to r/s 03/01/23 appointment since u/s not done. No vm set up-Toni

## 2023-02-26 DIAGNOSIS — G8929 Other chronic pain: Secondary | ICD-10-CM | POA: Diagnosis not present

## 2023-02-26 DIAGNOSIS — M170 Bilateral primary osteoarthritis of knee: Secondary | ICD-10-CM | POA: Diagnosis not present

## 2023-02-28 ENCOUNTER — Ambulatory Visit: Payer: 59 | Admitting: Podiatry

## 2023-03-01 ENCOUNTER — Ambulatory Visit: Payer: 59 | Admitting: Physician Assistant

## 2023-03-12 ENCOUNTER — Other Ambulatory Visit: Payer: Self-pay | Admitting: Physician Assistant

## 2023-03-12 DIAGNOSIS — D472 Monoclonal gammopathy: Secondary | ICD-10-CM

## 2023-03-12 DIAGNOSIS — D696 Thrombocytopenia, unspecified: Secondary | ICD-10-CM

## 2023-03-21 ENCOUNTER — Ambulatory Visit: Payer: 59 | Admitting: Physician Assistant

## 2023-03-21 VITALS — BP 100/74 | HR 88 | Temp 97.8°F | Resp 16 | Ht 64.0 in | Wt 188.0 lb

## 2023-03-21 DIAGNOSIS — J0100 Acute maxillary sinusitis, unspecified: Secondary | ICD-10-CM | POA: Diagnosis not present

## 2023-03-21 DIAGNOSIS — N1832 Chronic kidney disease, stage 3b: Secondary | ICD-10-CM | POA: Diagnosis not present

## 2023-03-21 DIAGNOSIS — N1831 Chronic kidney disease, stage 3a: Secondary | ICD-10-CM | POA: Diagnosis not present

## 2023-03-21 DIAGNOSIS — E1122 Type 2 diabetes mellitus with diabetic chronic kidney disease: Secondary | ICD-10-CM | POA: Diagnosis not present

## 2023-03-21 DIAGNOSIS — K047 Periapical abscess without sinus: Secondary | ICD-10-CM

## 2023-03-21 LAB — POCT GLYCOSYLATED HEMOGLOBIN (HGB A1C): Hemoglobin A1C: 8 % — AB (ref 4.0–5.6)

## 2023-03-21 MED ORDER — OXYBUTYNIN CHLORIDE ER 5 MG PO TB24: 5.0000 mg | ORAL_TABLET | Freq: Every day | ORAL | 2 refills | Status: DC

## 2023-03-21 MED ORDER — SITAGLIPTIN PHOSPHATE 50 MG PO TABS: 50.0000 mg | ORAL_TABLET | Freq: Every day | ORAL | 1 refills | Status: DC

## 2023-03-21 MED ORDER — AZITHROMYCIN 250 MG PO TABS: ORAL_TABLET | ORAL | 0 refills | Status: DC

## 2023-03-21 NOTE — Progress Notes (Signed)
 [facility] [address] [place], [place] [address]  Internal MEDICINE  Office Visit Note  Patient Name: Tracey Love  [id number]  [id number]  Date of Service: 03/31/2023  Chief Complaint  Patient presents with   Follow-up   Diabetes    HPI Pt is here for routine follow up -Some jaw pain and sinus pressure, no coughing. Started about 1 week ago. States she has some teeth that need to be pulled, but they have not done so due to afib/blood thinner. Will follow up with dentist -was given mirabegron but hasn't taken it because it was different than brand myrtbetriq previously. It appears neither on formulary now. Will switch to alternative -pharmacy also gave her 25 instead of 50mg  [place] -Will see nephrology on April 17, never got [place] scheduled -did see ortho at [facility] and goes back in Feb for injection in knee -Bone density next week  Current Medication: Outpatient Encounter Medications as of 03/21/2023  Medication Sig   acetaminophen (TYLENOL) 500 MG tablet Take 1,000 mg by mouth every 8 (eight) hours as needed for mild pain or moderate pain.    apixaban (ELIQUIS) 5 MG TABS tablet Take 1 tablet (5 mg total) by mouth 2 (two) times daily.   aspirin EC 81 MG tablet Take 81 mg by mouth daily.   atorvastatin (LIPITOR) 40 MG tablet TAKE 1 TABLET BY MOUTH EVERY DAY   azithromycin (ZITHROMAX) 250 MG tablet Take one tab a day for 10 days for uri   Cholecalciferol (VITAMIN D-3) 1000 UNITS CAPS Take 1,000 Units by mouth daily.   colchicine 0.6 MG tablet TAKE 2 TABS ON THE DAY OF GOUT FLARE, TAKE 1 ADDITIONAL TAB 1 HR AFTER ON THE FIRST DAY. AFTER THE FIRST DAY TAKE 1 TABLET DAILY UNTIL GOUT FLARE RESOLVES. USE AS NEEDED FOR GOUT FLARES.   CRANBERRY PO Take 4,200 mg by mouth daily as needed.   furosemide (LASIX) 20 MG tablet TAKE 1 TABLET BY MOUTH EVERY DAY FOR 3 DAYS FOR SWELLING, THEN AS NEEDED   hydrochlorothiazide (HYDRODIURIL) 25 MG tablet TAKE 1 TABLET BY MOUTH EVERY  DAY   hydrOXYzine (ATARAX) 10 MG tablet Take 1 tablet (10 mg total) by mouth 3 (three) times daily as needed.   Lancets (ONETOUCH DELICA PLUS LANCET33G) MISC USE TO TEST BLOOD SUGAR ONCE DAILY   losartan (COZAAR) 25 MG tablet TAKE 1 TABLET (25 MG TOTAL) BY MOUTH DAILY.   meloxicam (MOBIC) 7.5 MG tablet TAKE 1 TABLET (7.5 MG TOTAL) BY MOUTH DAILY. AS NEEDED   omeprazole (PRILOSEC) 40 MG capsule TAKE 1 CAPSULE (40 MG TOTAL) BY MOUTH DAILY.   ONETOUCH ULTRA test strip USE TO TEST BLOOD SUGAR DAILY   oxybutynin (DITROPAN-XL) 5 MG 24 hr tablet Take 1 tablet (5 mg total) by mouth at bedtime.   Selenium 200 MCG CAPS Take by mouth daily.   TURMERIC PO Take 500 mg by mouth daily.   vitamin B-12 (CYANOCOBALAMIN) 500 MCG tablet Take 500 mcg by mouth daily.   vitamin E 100 UNIT capsule Take 100 Units by mouth daily.   [DISCONTINUED] JANUVIA 50 MG tablet TAKE 1 TABLET BY MOUTH EVERY DAY   [DISCONTINUED] MYRBETRIQ 25 MG TB24 tablet TAKE 1 TABLET BY MOUTH EVERY DAY   [DISCONTINUED] sitaGLIPtin (JANUVIA) 25 MG tablet TAKE 1 TABLET (25 MG TOTAL) BY MOUTH DAILY.   sitaGLIPtin (JANUVIA) 50 MG tablet Take 1 tablet (50 mg total) by mouth daily.   No facility-administered encounter medications on file as  of 03/21/2023.    Surgical History: Past Surgical History:  Procedure Laterality Date   CATARACT EXTRACTION W/PHACO Right 10/21/2018   Procedure: CATARACT EXTRACTION PHACO AND INTRAOCULAR LENS PLACEMENT (IOC) RIGHT VISION BLUE DIABETIC  08.27.0  24.8%  126.0;  Surgeon: Galen Manila, MD;  Location: [facility];  Service: Ophthalmology;  Laterality: Right;  diabetic - oral meds   CATARACT EXTRACTION W/PHACO Left 11/18/2018   Procedure: CATARACT EXTRACTION PHACO AND INTRAOCULAR LENS PLACEMENT (IOC) LEFT DIABETIC 01:18.5     15.8%    12.45;  Surgeon: Galen Manila, MD;  Location: [facility];  Service: Ophthalmology;  Laterality: Left;  DIABETIC   TUBAL LIGATION  1966    Medical  History: Past Medical History:  Diagnosis Date   A-fib (HCC)    Diabetes mellitus, type 2 (HCC)    Gout    feet   Hypertension    MGUS (monoclonal gammopathy of unknown significance)    Osteoporosis    Thyroid nodule     Family History: Family History  Problem Relation Age of Onset   Asthma Sister    Breast cancer Neg Hx     Social History   Socioeconomic History   Marital status: Divorced    Spouse name: Not on file   Number of children: Not on file   Years of education: Not on file   Highest education level: Not on file  Occupational History   Not on file  Tobacco Use   Smoking status: Never   Smokeless tobacco: Never  Vaping Use   Vaping status: Never Used  Substance and Sexual Activity   Alcohol use: Never   Drug use: Never   Sexual activity: Not on file  Other Topics Concern   Not on file  Social History Narrative   Not on file   Social Drivers of Health   Financial Resource Strain: Medium Risk (03/27/2023)   Received from [facility]   Overall Financial Resource Strain (CARDIA)    Difficulty of Paying Living Expenses: Somewhat hard  Food Insecurity: Food Insecurity Present (03/27/2023)   Received from [facility]   Hunger Vital Sign    Worried About Running Out of Food in the Last Year: Never true    Ran Out of Food in the Last Year: Sometimes true  Transportation Needs: No Transportation Needs (03/27/2023)   Received from [facility] - [facility]    In the past 12 months, has lack of transportation kept you from medical appointments or from getting medications?: No    Lack of Transportation (Non-Medical): No  Physical Activity: Not on file  Stress: Not on file  Social Connections: Not on file  Intimate Partner Violence: Not on file      Review of Systems  Constitutional:  Negative for chills, fatigue and unexpected weight change.  HENT:  Positive for congestion and dental  problem. Negative for rhinorrhea, sneezing and sore throat.   Eyes:  Negative for redness.  Respiratory:  Negative for cough, chest tightness, shortness of breath and wheezing.   Cardiovascular:  Negative for chest pain and palpitations.  Gastrointestinal:  Negative for abdominal pain, constipation, diarrhea, nausea and vomiting.  Genitourinary:  Negative for dysuria and frequency.  Musculoskeletal:  Positive for arthralgias. Negative for back pain, joint swelling and neck pain.  Skin:  Negative for rash.  Neurological: Negative.  Negative for dizziness, tremors, light-headedness and numbness.  Hematological:  Negative for adenopathy. Does not bruise/bleed  easily.  Psychiatric/Behavioral:  Negative for behavioral problems (Depression), sleep disturbance and suicidal ideas. The patient is not nervous/anxious.     Vital Signs: BP 100/74   Pulse 88   Temp 97.8 F (36.6 C)   Resp 16   Ht 5\' 4"  (1.626 m)   Wt 188 lb (85.3 kg)   SpO2 97%   BMI 32.27 kg/m    Physical Exam Vitals and nursing note reviewed.  Constitutional:      General: She is not in acute distress.    Appearance: Normal appearance. She is normal weight. She is not ill-appearing.  HENT:     Head: Normocephalic and atraumatic.  Eyes:     Extraocular Movements: Extraocular movements intact.     Pupils: Pupils are equal, round, and reactive to light.  Cardiovascular:     Rate and Rhythm: Normal rate and regular rhythm.  Pulmonary:     Effort: Pulmonary effort is normal. No respiratory distress.  Skin:    General: Skin is warm and dry.  Neurological:     Mental Status: She is alert and oriented to person, place, and time.     Cranial Nerves: No cranial nerve deficit.     Coordination: Coordination normal.     Gait: Gait abnormal.  Psychiatric:        Mood and Affect: Mood normal.        Behavior: Behavior normal.        Assessment/Plan: 1. Type 2 diabetes mellitus with stage 3a chronic kidney disease,  without long-term current use of insulin (HCC) (Primary) - POCT HgB A1C is 8.0 which is up from 7.1 last check, did get wrong script from pharmacy and will resend 50mg  [place]. Work on diet and exercise as able. Will also have appt with nephrology in April - sitaGLIPtin (JANUVIA) 50 MG tablet; Take 1 tablet (50 mg total) by mouth daily.  Dispense: 90 tablet; Refill: 1  2. Dental infection Will send zpak for congestion and dental infection. Advised to follow up with dentist - azithromycin (ZITHROMAX) 250 MG tablet; Take one tab a day for 10 days for uri  Dispense: 10 tablet; Refill: 0  3. Acute non-recurrent maxillary sinusitis - azithromycin (ZITHROMAX) 250 MG tablet; Take one tab a day for 10 days for uri  Dispense: 10 tablet; Refill: 0   General Counseling: Ayodele verbalizes understanding of the findings of todays visit and agrees with plan of treatment. I have discussed any further diagnostic evaluation that may be needed or ordered today. We also reviewed her medications today. she has been encouraged to call the office with any questions or concerns that should arise related to todays visit.    Orders Placed This Encounter  Procedures   POCT HgB A1C    Meds ordered this encounter  Medications   oxybutynin (DITROPAN-XL) 5 MG 24 hr tablet    Sig: Take 1 tablet (5 mg total) by mouth at bedtime.    Dispense:  30 tablet    Refill:  2   sitaGLIPtin (JANUVIA) 50 MG tablet    Sig: Take 1 tablet (50 mg total) by mouth daily.    Dispense:  90 tablet    Refill:  1   azithromycin (ZITHROMAX) 250 MG tablet    Sig: Take one tab a day for 10 days for uri    Dispense:  10 tablet    Refill:  0    This patient was seen by Lynn Ito, PA-C in collaboration with Dr. Beverely Risen  as a part of collaborative care agreement.   Total time spent:30 Minutes Time spent includes review of chart, medications, test results, and follow up plan with the patient.      Dr Lyndon Code Internal  medicine

## 2023-03-26 ENCOUNTER — Other Ambulatory Visit: Payer: Self-pay

## 2023-03-27 DIAGNOSIS — M1712 Unilateral primary osteoarthritis, left knee: Secondary | ICD-10-CM | POA: Diagnosis not present

## 2023-04-14 ENCOUNTER — Other Ambulatory Visit: Payer: Self-pay | Admitting: Physician Assistant

## 2023-04-14 DIAGNOSIS — N1831 Chronic kidney disease, stage 3a: Secondary | ICD-10-CM

## 2023-04-23 ENCOUNTER — Other Ambulatory Visit: Payer: Self-pay | Admitting: Physician Assistant

## 2023-04-23 DIAGNOSIS — E1122 Type 2 diabetes mellitus with diabetic chronic kidney disease: Secondary | ICD-10-CM

## 2023-04-26 ENCOUNTER — Encounter: Payer: Self-pay | Admitting: Podiatry

## 2023-04-26 ENCOUNTER — Ambulatory Visit (INDEPENDENT_AMBULATORY_CARE_PROVIDER_SITE_OTHER): Payer: 59 | Admitting: Podiatry

## 2023-04-26 VITALS — Ht 64.0 in | Wt 188.0 lb

## 2023-04-26 DIAGNOSIS — M79675 Pain in left toe(s): Secondary | ICD-10-CM

## 2023-04-26 DIAGNOSIS — B351 Tinea unguium: Secondary | ICD-10-CM

## 2023-04-26 DIAGNOSIS — E119 Type 2 diabetes mellitus without complications: Secondary | ICD-10-CM | POA: Diagnosis not present

## 2023-04-26 DIAGNOSIS — M79674 Pain in right toe(s): Secondary | ICD-10-CM

## 2023-05-01 ENCOUNTER — Encounter: Payer: Self-pay | Admitting: Podiatry

## 2023-05-01 NOTE — Progress Notes (Signed)
  Subjective:  Patient ID: Tracey Love, female    DOB: 08-Oct-1940,  MRN: [id number]  83 y.o. female presents preventative diabetic foot care and painful, elongated thickened toenails x 10 which are symptomatic when wearing enclosed shoe gear. This interferes with his/her daily activities.  Chief Complaint  Patient presents with   Nail Problem    Pt is here for [facility] unsure of last A1C PCP is Dr Lewis Moccasin and LOV was in January.   New problem(s): None   PCP is McDonough, Salomon Fick, PA-C.  Allergies  Allergen Reactions   Gabapentin     Upset stomach   Penicillins     Doesn't remember reaction   Rivaroxaban Other (See Comments)    Patient stated she had a lot of blood gushing in her mouth when she brushed her teeth    Review of Systems: Negative except as noted in the HPI.   Objective:  Tracey Love is a pleasant 83 y.o. female in NAD. AAO x 3.  Vascular Examination: Vascular status intact b/l with palpable pedal pulses. CFT immediate b/l. Pedal hair present. No edema. No pain with calf compression b/l. Skin temperature gradient WNL b/l. No varicosities noted. No cyanosis or clubbing noted.  Neurological Examination: Sensation grossly intact b/l with 10 gram monofilament. Vibratory sensation intact b/l.  Dermatological Examination: Pedal skin with normal turgor, texture and tone b/l. No open wounds nor interdigital macerations noted. Toenails 1-5 b/l thick, discolored, elongated with subungual debris and pain on dorsal palpation. No hyperkeratotic lesions noted b/l. Heels dry b/l.  Musculoskeletal Examination: Muscle strength 5/5 to b/l LE.  No pain, crepitus noted b/l. No gross pedal deformities. Patient ambulates independently without assistive aids.   Radiographs: None  Last A1c:      Latest Ref Rng & Units 03/21/2023    9:40 AM 12/13/2022    9:47 AM 06/28/2022    9:00 AM  Hemoglobin A1C  Hemoglobin-A1c 4.0 - 5.6 % 8.0  7.1  7.0      Assessment:   1. Pain due to  onychomycosis of toenails of both feet   2. Type 2 diabetes mellitus without complication, without long-term current use of insulin (HCC)    Plan:  -Patient was evaluated today. All questions/concerns addressed on today's visit. -Advised patient to apply Vaseline Petroleum Jelly to heels daily to minimize dryness. -Patient to continue soft, supportive shoe gear daily. -Toenails 1-5 b/l were debrided in length and girth with sterile nail nippers and dremel without iatrogenic bleeding.  -Patient/POA to call should there be question/concern in the interim.  Return in about 3 months (around 07/27/2023).  Tracey Love, DPM      [facility] LOCATION: [address] [place] [address]                   Office [phone]   [facility] LOCATION: [address] [place], [place] [address] Office [phone]

## 2023-05-15 ENCOUNTER — Ambulatory Visit
Admission: RE | Admit: 2023-05-15 | Discharge: 2023-05-15 | Disposition: A | Discharge status: Home / Self Care | Payer: Self-pay | Source: Ambulatory Visit | Attending: Physician Assistant | Admitting: Physician Assistant

## 2023-05-15 DIAGNOSIS — M109 Gout, unspecified: Secondary | ICD-10-CM | POA: Diagnosis not present

## 2023-05-15 DIAGNOSIS — M069 Rheumatoid arthritis, unspecified: Secondary | ICD-10-CM | POA: Insufficient documentation

## 2023-05-15 DIAGNOSIS — Z13820 Encounter for screening for osteoporosis: Secondary | ICD-10-CM | POA: Insufficient documentation

## 2023-05-15 DIAGNOSIS — M8580 Other specified disorders of bone density and structure, unspecified site: Secondary | ICD-10-CM

## 2023-05-15 DIAGNOSIS — Z780 Asymptomatic menopausal state: Secondary | ICD-10-CM | POA: Diagnosis not present

## 2023-05-15 DIAGNOSIS — E119 Type 2 diabetes mellitus without complications: Secondary | ICD-10-CM | POA: Diagnosis not present

## 2023-05-15 DIAGNOSIS — M85832 Other specified disorders of bone density and structure, left forearm: Secondary | ICD-10-CM | POA: Diagnosis not present

## 2023-05-29 DIAGNOSIS — Z961 Presence of intraocular lens: Secondary | ICD-10-CM | POA: Diagnosis not present

## 2023-05-29 DIAGNOSIS — E119 Type 2 diabetes mellitus without complications: Secondary | ICD-10-CM | POA: Diagnosis not present

## 2023-05-29 DIAGNOSIS — H26492 Other secondary cataract, left eye: Secondary | ICD-10-CM | POA: Diagnosis not present

## 2023-05-29 LAB — HM DIABETES EYE EXAM

## 2023-06-18 ENCOUNTER — Other Ambulatory Visit: Payer: Self-pay

## 2023-07-01 ENCOUNTER — Ambulatory Visit: Payer: 59 | Admitting: Physician Assistant

## 2023-07-18 ENCOUNTER — Ambulatory Visit (INDEPENDENT_AMBULATORY_CARE_PROVIDER_SITE_OTHER): Payer: 59 | Admitting: Physician Assistant

## 2023-07-18 ENCOUNTER — Encounter: Payer: Self-pay | Admitting: Physician Assistant

## 2023-07-18 ENCOUNTER — Telehealth: Payer: Self-pay | Admitting: Physician Assistant

## 2023-07-18 VITALS — BP 135/60 | HR 76 | Temp 98.4°F | Resp 16 | Ht 64.0 in | Wt 192.0 lb

## 2023-07-18 DIAGNOSIS — E1122 Type 2 diabetes mellitus with diabetic chronic kidney disease: Secondary | ICD-10-CM | POA: Diagnosis not present

## 2023-07-18 DIAGNOSIS — I10 Essential (primary) hypertension: Secondary | ICD-10-CM

## 2023-07-18 DIAGNOSIS — N1831 Chronic kidney disease, stage 3a: Secondary | ICD-10-CM

## 2023-07-18 DIAGNOSIS — M8580 Other specified disorders of bone density and structure, unspecified site: Secondary | ICD-10-CM | POA: Diagnosis not present

## 2023-07-18 DIAGNOSIS — Z0000 Encounter for general adult medical examination without abnormal findings: Secondary | ICD-10-CM

## 2023-07-18 DIAGNOSIS — N1832 Chronic kidney disease, stage 3b: Secondary | ICD-10-CM

## 2023-07-18 LAB — POCT GLYCOSYLATED HEMOGLOBIN (HGB A1C): Hemoglobin A1C: 8 % — AB (ref 4.0–5.6)

## 2023-07-18 NOTE — Progress Notes (Deleted)
 [facility] [address] [place], [place] [address]  Internal MEDICINE  Office Visit Note  Patient Name: Tracey Love  [id number]  [id number]  Date of Service: 07/18/2023  Chief Complaint  Patient presents with   Medicare Wellness   Diabetes   Hypertension    HPI     Current Medication: Outpatient Encounter Medications as of 07/18/2023  Medication Sig   acetaminophen  (TYLENOL ) 500 MG tablet Take 1,000 mg by mouth every 8 (eight) hours as needed for mild pain or moderate pain.    aspirin EC 81 MG tablet Take 81 mg by mouth daily.   atorvastatin  (LIPITOR) 40 MG tablet TAKE 1 TABLET BY MOUTH EVERY DAY   azithromycin  (ZITHROMAX ) 250 MG tablet Take one tab a day for 10 days for uri   Cholecalciferol (VITAMIN D -3) 1000 UNITS CAPS Take 1,000 Units by mouth daily.   colchicine  0.6 MG tablet TAKE 2 TABS ON THE DAY OF GOUT FLARE, TAKE 1 ADDITIONAL TAB 1 HR AFTER ON THE FIRST DAY. AFTER THE FIRST DAY TAKE 1 TABLET DAILY UNTIL GOUT FLARE RESOLVES. USE AS NEEDED FOR GOUT FLARES.   CRANBERRY PO Take 4,200 mg by mouth daily as needed.   ELIQUIS  5 MG TABS tablet TAKE 1 TABLET BY MOUTH TWICE A DAY   furosemide  (LASIX ) 20 MG tablet TAKE 1 TABLET BY MOUTH EVERY DAY FOR 3 DAYS FOR SWELLING, THEN AS NEEDED   hydrochlorothiazide  (HYDRODIURIL ) 25 MG tablet TAKE 1 TABLET BY MOUTH EVERY DAY   hydrOXYzine  (ATARAX ) 10 MG tablet Take 1 tablet (10 mg total) by mouth 3 (three) times daily as needed.   Lancets (ONETOUCH DELICA PLUS LANCET33G) MISC USE TO TEST BLOOD SUGAR ONCE DAILY   losartan  (COZAAR ) 25 MG tablet TAKE 1 TABLET (25 MG TOTAL) BY MOUTH DAILY.   meloxicam  (MOBIC ) 7.5 MG tablet TAKE 1 TABLET (7.5 MG TOTAL) BY MOUTH DAILY. AS NEEDED   omeprazole  (PRILOSEC) 40 MG capsule TAKE 1 CAPSULE (40 MG TOTAL) BY MOUTH DAILY.   ONETOUCH ULTRA test strip USE TO TEST BLOOD SUGAR DAILY   oxybutynin  (DITROPAN -XL) 5 MG 24 hr tablet TAKE 1 TABLET BY MOUTH EVERYDAY AT BEDTIME   Selenium 200 MCG CAPS  Take by mouth daily.   sitaGLIPtin  (JANUVIA ) 50 MG tablet Take 1 tablet (50 mg total) by mouth daily.   TURMERIC PO Take 500 mg by mouth daily.   vitamin B-12 (CYANOCOBALAMIN ) 500 MCG tablet Take 500 mcg by mouth daily.   vitamin E 100 UNIT capsule Take 100 Units by mouth daily.   No facility-administered encounter medications on file as of 07/18/2023.    Surgical History: Past Surgical History:  Procedure Laterality Date   CATARACT EXTRACTION W/PHACO Right 10/21/2018   Procedure: CATARACT EXTRACTION PHACO AND INTRAOCULAR LENS PLACEMENT (IOC) RIGHT VISION BLUE DIABETIC  08.27.0  24.8%  126.0;  Surgeon: Clair Crews, MD;  Location: [facility];  Service: Ophthalmology;  Laterality: Right;  diabetic - oral meds   CATARACT EXTRACTION W/PHACO Left 11/18/2018   Procedure: CATARACT EXTRACTION PHACO AND INTRAOCULAR LENS PLACEMENT (IOC) LEFT DIABETIC 01:18.5     15.8%    12.45;  Surgeon: Clair Crews, MD;  Location: [facility];  Service: Ophthalmology;  Laterality: Left;  DIABETIC   TUBAL LIGATION  1966    Medical History: Past Medical History:  Diagnosis Date   A-fib (HCC)    Diabetes mellitus, type 2 (HCC)    Gout    feet   Hypertension  MGUS (monoclonal gammopathy of unknown significance)    Osteoporosis    Thyroid  nodule     Family History: Family History  Problem Relation Age of Onset   Asthma Sister    Breast cancer Neg Hx     Social History   Socioeconomic History   Marital status: Divorced    Spouse name: Not on file   Number of children: Not on file   Years of education: Not on file   Highest education level: Not on file  Occupational History   Not on file  Tobacco Use   Smoking status: Never   Smokeless tobacco: Never  Vaping Use   Vaping status: Never Used  Substance and Sexual Activity   Alcohol use: Never   Drug use: Never   Sexual activity: Not on file  Other Topics Concern   Not on file  Social History Narrative   Not on  file   Social Drivers of Health   Financial Resource Strain: Medium Risk (03/27/2023)   Received from [facility]   Overall Financial Resource Strain (CARDIA)    Difficulty of Paying Living Expenses: Somewhat hard  Food Insecurity: Food Insecurity Present (03/27/2023)   Received from [facility]   Hunger Vital Sign    Worried About Running Out of Food in the Last Year: Never true    Ran Out of Food in the Last Year: Sometimes true  Transportation Needs: No Transportation Needs (03/27/2023)   Received from [facility] - Transportation    In the past 12 months, has lack of transportation kept you from medical appointments or from getting medications?: No    Lack of Transportation (Non-Medical): No  Physical Activity: Not on file  Stress: Not on file  Social Connections: Not on file  Intimate Partner Violence: Not on file      Review of Systems  Vital Signs: BP 135/60   Pulse 76   Temp 98.4 F (36.9 C)   Resp 16   Ht 5\' 4"  (1.626 m)   Wt 192 lb (87.1 kg)   SpO2 97%   BMI 32.96 kg/m    Physical Exam     Assessment/Plan:   General Counseling: Santoria verbalizes understanding of the findings of todays visit and agrees with plan of treatment. I have discussed any further diagnostic evaluation that may be needed or ordered today. We also reviewed her medications today. she has been encouraged to call the office with any questions or concerns that should arise related to todays visit.    Orders Placed This Encounter  Procedures   POCT HgB A1C    No orders of the defined types were placed in this encounter.   This patient was seen by Taylor Favia, PA-C in collaboration with Dr. Verneta Gone as a part of collaborative care agreement.   Total time spent:*** Minutes Time spent includes review of chart, medications, test results, and follow up plan with the patient.      Dr Fozia M Khan Internal  medicine

## 2023-07-18 NOTE — Progress Notes (Signed)
 [facility] [address] [place], [place] [address]  Internal MEDICINE  Office Visit Note  Patient Name: Tracey Love  [id number]  [id number]  Date of Service: 07/18/2023  Chief Complaint  Patient presents with   Medicare Wellness   Diabetes   Hypertension    HPI Tracey Love presents for an annual well visit  Well-appearing 83 y.o.female DEXA scan: reviewed bone density, stable-osteopenic in forearm only as before.  Eye exam and/or foot exam: UTD, seeing podiatry now Labs: slip given Other concerns: states still getting 25mg  januvia  instead of 50mg  from pharmacy, will increase to 75mg  daily since getting both. May go up further in future pending response. Never had abdominal US  or nephrology consult. Discussed need to move forward with these      07/18/2023   10:00 AM 06/28/2022    8:53 AM 06/26/2021    8:52 AM  MMSE - Mini Mental State Exam  Orientation to time 5 5 5   Orientation to Place 5 5 5   Registration 3 3 3   Attention/ Calculation 5 5 5   Recall 3 3 3   Language- name 2 objects 2 2 2   Language- repeat 1 1 1   Language- follow 3 step command 3 3 3   Language- read & follow direction 1 1 1   Write a sentence 1 1 0  Copy design 1 1 1   Total score 30 30 29     Functional Status Survey: Is the patient deaf or have difficulty hearing?: No Does the patient have difficulty seeing, even when wearing glasses/contacts?: No Does the patient have difficulty concentrating, remembering, or making decisions?: No Does the patient have difficulty walking or climbing stairs?: No Does the patient have difficulty dressing or bathing?: No Does the patient have difficulty doing errands alone such as visiting a doctor's office or shopping?: No     06/28/2022    8:53 AM 10/18/2022    9:48 AM 01/10/2023    9:29 AM 03/21/2023    9:25 AM 07/18/2023   10:00 AM  Fall Risk  Falls in the past year? 0 0 0 0 0  Patient at Risk for Falls Due to    No Fall Risks   Fall risk Follow up     Falls evaluation completed        07/18/2023   10:00 AM  Depression screen PHQ 2/9  Decreased Interest 0  Down, Depressed, Hopeless 0  PHQ - 2 Score 0        No data to display            Current Medication: Outpatient Encounter Medications as of 07/18/2023  Medication Sig   acetaminophen  (TYLENOL ) 500 MG tablet Take 1,000 mg by mouth every 8 (eight) hours as needed for mild pain or moderate pain.    aspirin EC 81 MG tablet Take 81 mg by mouth daily.   azithromycin  (ZITHROMAX ) 250 MG tablet Take one tab a day for 10 days for uri   Cholecalciferol (VITAMIN D -3) 1000 UNITS CAPS Take 1,000 Units by mouth daily.   colchicine  0.6 MG tablet TAKE 2 TABS ON THE DAY OF GOUT FLARE, TAKE 1 ADDITIONAL TAB 1 HR AFTER ON THE FIRST DAY. AFTER THE FIRST DAY TAKE 1 TABLET DAILY UNTIL GOUT FLARE RESOLVES. USE AS NEEDED FOR GOUT FLARES.   CRANBERRY PO Take 4,200 mg by mouth daily as needed.   ELIQUIS  5 MG TABS tablet TAKE 1 TABLET BY MOUTH TWICE A DAY   hydrOXYzine  (ATARAX ) 10 MG tablet Take  1 tablet (10 mg total) by mouth 3 (three) times daily as needed.   Lancets (ONETOUCH DELICA PLUS LANCET33G) MISC USE TO TEST BLOOD SUGAR ONCE DAILY   losartan  (COZAAR ) 25 MG tablet TAKE 1 TABLET (25 MG TOTAL) BY MOUTH DAILY.   meloxicam  (MOBIC ) 7.5 MG tablet TAKE 1 TABLET (7.5 MG TOTAL) BY MOUTH DAILY. AS NEEDED   omeprazole  (PRILOSEC) 40 MG capsule TAKE 1 CAPSULE (40 MG TOTAL) BY MOUTH DAILY.   ONETOUCH ULTRA test strip USE TO TEST BLOOD SUGAR DAILY   oxybutynin  (DITROPAN -XL) 5 MG 24 hr tablet TAKE 1 TABLET BY MOUTH EVERYDAY AT BEDTIME   Selenium 200 MCG CAPS Take by mouth daily.   sitaGLIPtin  (JANUVIA ) 50 MG tablet Take 1 tablet (50 mg total) by mouth daily.   TURMERIC PO Take 500 mg by mouth daily.   vitamin B-12 (CYANOCOBALAMIN ) 500 MCG tablet Take 500 mcg by mouth daily.   vitamin E 100 UNIT capsule Take 100 Units by mouth daily.   [DISCONTINUED] atorvastatin  (LIPITOR) 40 MG tablet TAKE 1 TABLET BY  MOUTH EVERY DAY   [DISCONTINUED] furosemide  (LASIX ) 20 MG tablet TAKE 1 TABLET BY MOUTH EVERY DAY FOR 3 DAYS FOR SWELLING, THEN AS NEEDED   [DISCONTINUED] hydrochlorothiazide  (HYDRODIURIL ) 25 MG tablet TAKE 1 TABLET BY MOUTH EVERY DAY   No facility-administered encounter medications on file as of 07/18/2023.    Surgical History: Past Surgical History:  Procedure Laterality Date   CATARACT EXTRACTION W/PHACO Right 10/21/2018   Procedure: CATARACT EXTRACTION PHACO AND INTRAOCULAR LENS PLACEMENT (IOC) RIGHT VISION BLUE DIABETIC  08.27.0  24.8%  126.0;  Surgeon: Clair Crews, MD;  Location: [facility];  Service: Ophthalmology;  Laterality: Right;  diabetic - oral meds   CATARACT EXTRACTION W/PHACO Left 11/18/2018   Procedure: CATARACT EXTRACTION PHACO AND INTRAOCULAR LENS PLACEMENT (IOC) LEFT DIABETIC 01:18.5     15.8%    12.45;  Surgeon: Clair Crews, MD;  Location: [facility];  Service: Ophthalmology;  Laterality: Left;  DIABETIC   TUBAL LIGATION  1966    Medical History: Past Medical History:  Diagnosis Date   A-fib (HCC)    Diabetes mellitus, type 2 (HCC)    Gout    feet   Hypertension    MGUS (monoclonal gammopathy of unknown significance)    Osteoporosis    Thyroid  nodule     Family History: Family History  Problem Relation Age of Onset   Asthma Sister    Breast cancer Neg Hx     Social History   Socioeconomic History   Marital status: Divorced    Spouse name: Not on file   Number of children: Not on file   Years of education: Not on file   Highest education level: Not on file  Occupational History   Not on file  Tobacco Use   Smoking status: Never   Smokeless tobacco: Never  Vaping Use   Vaping status: Never Used  Substance and Sexual Activity   Alcohol use: Never   Drug use: Never   Sexual activity: Not on file  Other Topics Concern   Not on file  Social History Narrative   Not on file   Social Drivers of Health   Financial  Resource Strain: Medium Risk (03/27/2023)   Received from [facility]   Overall Financial Resource Strain (CARDIA)    Difficulty of Paying Living Expenses: Somewhat hard  Food Insecurity: Food Insecurity Present (03/27/2023)   [facility] from [facility]  Hunger Vital Sign    Within the past 12 months, you worried that your food would run out before you got the money to buy more.: Never true    Within the past 12 months, the food you bought just didn't last and you didn't have money to get more.: Sometimes true  Transportation Needs: No Transportation Needs (03/27/2023)   Received from [facility] - Transportation    In the past 12 months, has lack of transportation kept you from medical appointments or from getting medications?: No    Lack of Transportation (Non-Medical): No  Physical Activity: Not on file  Stress: Not on file  Social Connections: Not on file  Intimate Partner Violence: Not on file      Review of Systems  Constitutional:  Negative for chills, fatigue and unexpected weight change.  HENT:  Negative for rhinorrhea, sneezing and sore throat.   Eyes:  Negative for redness.  Respiratory:  Negative for cough, chest tightness, shortness of breath and wheezing.   Cardiovascular:  Negative for chest pain and palpitations.  Gastrointestinal:  Negative for abdominal pain, constipation, diarrhea, nausea and vomiting.  Genitourinary:  Negative for dysuria and frequency.  Musculoskeletal:  Positive for arthralgias. Negative for back pain, joint swelling and neck pain.  Skin:  Negative for rash.  Neurological: Negative.  Negative for dizziness, tremors, light-headedness and numbness.  Hematological:  Negative for adenopathy. Does not bruise/bleed easily.  Psychiatric/Behavioral:  Negative for behavioral problems (Depression), sleep disturbance and suicidal ideas. The patient is not nervous/anxious.     Vital Signs: BP  135/60   Pulse 76   Temp 98.4 F (36.9 C)   Resp 16   Ht 5' 4 (1.626 m)   Wt 192 lb (87.1 kg)   SpO2 97%   BMI 32.96 kg/m    Physical Exam Vitals and nursing note reviewed.  HENT:     Head: Normocephalic and atraumatic.   Eyes:     Extraocular Movements: Extraocular movements intact.    Cardiovascular:     Rate and Rhythm: Normal rate and regular rhythm.  Pulmonary:     Effort: Pulmonary effort is normal.     Breath sounds: Normal breath sounds.   Skin:    General: Skin is warm and dry.   Neurological:     General: No focal deficit present.   Psychiatric:        Mood and Affect: Mood normal.        Behavior: Behavior normal.        Assessment/Plan: 1. Encounter for annual wellness exam in Medicare patient (Primary) AWV performed, lab slip given  2. Type 2 diabetes mellitus with stage 3b chronic kidney disease, without long-term current use of insulin (HCC) - POCT HgB A1C is 8.0 which is stable from previous. Will raise Jardiance to 75mg  and pt will work on diet and exercise as able.  3. Essential hypertension Stable, continue current medication  4. Osteopenia, unspecified location Will hold off on bisphosphonate given stable osteopenia in 1 site only and reduced renal function. Will ensure vit D stable.    General Counseling: blaise palladino understanding of the findings of todays visit and agrees with plan of treatment. I have discussed any further diagnostic evaluation that may be needed or ordered today. We also reviewed her medications today. she has been encouraged to call the office with any questions or concerns that should arise related to todays visit.    Orders Placed This Encounter  Procedures   POCT HgB A1C    No orders of the defined types were placed in this encounter.   Return in about 4 weeks (around 08/15/2023) for BG check, needs abdominal US  still and Nephrology referral never happened.   Total time spent:35 Minutes Time  spent includes review of chart, medications, test results, and follow up plan with the patient.   [place] Controlled Substance Database was reviewed by me.  This patient was seen by Taylor Favia, PA-C in collaboration with Dr. Verneta Gone as a part of collaborative care agreement.  Taylor Favia, PA-C Internal medicine

## 2023-07-18 NOTE — Telephone Encounter (Signed)
 Gave patient telephone #s again to schedule abdominal u/s and for Nephrology referral-Toni

## 2023-07-20 ENCOUNTER — Other Ambulatory Visit: Payer: Self-pay | Admitting: Physician Assistant

## 2023-07-20 DIAGNOSIS — E782 Mixed hyperlipidemia: Secondary | ICD-10-CM

## 2023-07-20 DIAGNOSIS — D472 Monoclonal gammopathy: Secondary | ICD-10-CM

## 2023-07-20 DIAGNOSIS — R600 Localized edema: Secondary | ICD-10-CM

## 2023-07-20 DIAGNOSIS — D696 Thrombocytopenia, unspecified: Secondary | ICD-10-CM

## 2023-07-29 ENCOUNTER — Other Ambulatory Visit: Payer: Self-pay | Admitting: Physician Assistant

## 2023-07-29 DIAGNOSIS — E782 Mixed hyperlipidemia: Secondary | ICD-10-CM | POA: Diagnosis not present

## 2023-07-29 DIAGNOSIS — Z1329 Encounter for screening for other suspected endocrine disorder: Secondary | ICD-10-CM | POA: Diagnosis not present

## 2023-07-29 DIAGNOSIS — E538 Deficiency of other specified B group vitamins: Secondary | ICD-10-CM | POA: Diagnosis not present

## 2023-07-29 DIAGNOSIS — Z0000 Encounter for general adult medical examination without abnormal findings: Secondary | ICD-10-CM | POA: Diagnosis not present

## 2023-07-29 DIAGNOSIS — E559 Vitamin D deficiency, unspecified: Secondary | ICD-10-CM | POA: Diagnosis not present

## 2023-07-30 LAB — MICROSCOPIC EXAMINATION
Bacteria, UA: NONE SEEN
Casts: NONE SEEN /LPF
WBC, UA: NONE SEEN /HPF (ref 0–5)

## 2023-07-30 LAB — CBC WITH DIFFERENTIAL/PLATELET
Basos: 1 %
EOS (ABSOLUTE): 0 10*3/uL (ref 0.0–0.2)
Eos: 1 %
Hematocrit: 39.7 % (ref 34.0–46.6)
Hemoglobin: 13.1 g/dL (ref 11.1–15.9)
Immature Granulocytes: 0 %
Immature Granulocytes: 0 10*3/uL (ref 0.0–0.1)
Lymphs: 36 %
MCH: 36 pg — ABNORMAL HIGH (ref 26.6–33.0)
MCHC: 33 g/dL (ref 31.5–35.7)
MCV: 109 fL — ABNORMAL HIGH (ref 79–97)
Monocytes Absolute: 0.1 10*3/uL (ref 0.0–0.4)
Monocytes Absolute: 0.6 10*3/uL (ref 0.1–0.9)
Monocytes: 11 %
Neutrophils Absolute: 2 10*3/uL (ref 0.7–3.1)
Neutrophils Absolute: 2.8 10*3/uL (ref 1.4–7.0)
Neutrophils: 51 %
Platelets: 113 10*3/uL — ABNORMAL LOW (ref 150–450)
RBC: 3.64 x10E6/uL — ABNORMAL LOW (ref 3.77–5.28)
RDW: 12.5 % (ref 11.7–15.4)
WBC: 5.5 10*3/uL (ref 3.4–10.8)

## 2023-07-30 LAB — COMPREHENSIVE METABOLIC PANEL WITH GFR
ALT: 32 IU/L (ref 0–32)
AST: 29 IU/L (ref 0–40)
Albumin: 4.7 g/dL (ref 3.7–4.7)
Alkaline Phosphatase: 205 IU/L — ABNORMAL HIGH (ref 44–121)
BUN/Creatinine Ratio: 18 (ref 12–28)
BUN: 22 mg/dL (ref 8–27)
Bilirubin Total: 0.5 mg/dL (ref 0.0–1.2)
CO2: 23 mmol/L (ref 20–29)
Calcium: 10.4 mg/dL — ABNORMAL HIGH (ref 8.7–10.3)
Chloride: 97 mmol/L (ref 96–106)
Creatinine, Ser: 1.24 mg/dL — ABNORMAL HIGH (ref 0.57–1.00)
Globulin, Total: 3.3 g/dL (ref 1.5–4.5)
Glucose: 242 mg/dL — ABNORMAL HIGH (ref 70–99)
Potassium: 4 mmol/L (ref 3.5–5.2)
Sodium: 139 mmol/L (ref 134–144)
Total Protein: 8 g/dL (ref 6.0–8.5)
eGFR: 44 mL/min/{1.73_m2} — ABNORMAL LOW (ref >59)

## 2023-07-30 LAB — LIPID PANEL WITH LDL/HDL RATIO
Cholesterol, Total: 132 mg/dL (ref 100–199)
HDL: 70 mg/dL (ref >39)
LDL Chol Calc (NIH): 42 mg/dL (ref 0–99)
LDL/HDL Ratio: 0.6 ratio (ref 0.0–3.2)
Triglycerides: 116 mg/dL (ref 0–149)
VLDL Cholesterol Cal: 20 mg/dL (ref 5–40)

## 2023-07-30 LAB — UA/M W/RFLX CULTURE, ROUTINE
Bilirubin, UA: NEGATIVE
Ketones, UA: NEGATIVE
Leukocytes,UA: NEGATIVE
Nitrite, UA: NEGATIVE
Protein,UA: NEGATIVE
RBC, UA: NEGATIVE
Specific Gravity, UA: 1.013 (ref 1.005–1.030)
Urobilinogen, Ur: 0.2 mg/dL (ref 0.2–1.0)
pH, UA: 7.5 (ref 5.0–7.5)

## 2023-07-30 LAB — B12 AND FOLATE PANEL
Folate: >20 ng/mL (ref >3.0)
Vitamin B-12: >2000 pg/mL — ABNORMAL HIGH (ref 232–1245)

## 2023-07-30 LAB — VITAMIN D 25 HYDROXY (VIT D DEFICIENCY, FRACTURES): Vit D, 25-Hydroxy: 59.3 ng/mL (ref 30.0–100.0)

## 2023-07-30 LAB — TSH: TSH: 4.74 u[IU]/mL — ABNORMAL HIGH (ref 0.450–4.500)

## 2023-07-30 LAB — T4, FREE: Free T4: 1.03 ng/dL (ref 0.82–1.77)

## 2023-08-02 ENCOUNTER — Ambulatory Visit (INDEPENDENT_AMBULATORY_CARE_PROVIDER_SITE_OTHER): Admitting: Podiatry

## 2023-08-02 DIAGNOSIS — M79674 Pain in right toe(s): Secondary | ICD-10-CM | POA: Diagnosis not present

## 2023-08-02 DIAGNOSIS — M79675 Pain in left toe(s): Secondary | ICD-10-CM

## 2023-08-02 DIAGNOSIS — B351 Tinea unguium: Secondary | ICD-10-CM | POA: Diagnosis not present

## 2023-08-02 DIAGNOSIS — M2142 Flat foot [pes planus] (acquired), left foot: Secondary | ICD-10-CM | POA: Diagnosis not present

## 2023-08-02 DIAGNOSIS — E119 Type 2 diabetes mellitus without complications: Secondary | ICD-10-CM | POA: Diagnosis not present

## 2023-08-02 DIAGNOSIS — M2141 Flat foot [pes planus] (acquired), right foot: Secondary | ICD-10-CM

## 2023-08-07 ENCOUNTER — Other Ambulatory Visit: Payer: Self-pay | Admitting: Physician Assistant

## 2023-08-07 DIAGNOSIS — E1122 Type 2 diabetes mellitus with diabetic chronic kidney disease: Secondary | ICD-10-CM

## 2023-08-08 ENCOUNTER — Encounter: Payer: Self-pay | Admitting: Podiatry

## 2023-08-08 NOTE — Progress Notes (Signed)
 ANNUAL DIABETIC FOOT EXAM  Subjective: Tracey Love presents today for annual diabetic foot exam.  Patient confirms h/o diabetes.  Patient denies any h/o foot wounds.  Tracey Love, Tracey Reining, PA-C is patient's PCP. LOV 07/18/2023.  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/2020   Pulmonary hypertension, moderate to severe (HCC) 04/06/2020   Benign essential HTN 01/12/2019   Hyperlipidemia, mixed 01/12/2019   Paroxysmal A-fib (HCC) 01/12/2019   SOBOE (shortness of breath on exertion) 01/12/2019   MGUS (monoclonal gammopathy of unknown significance) 10/12/2015   Past Surgical History:  Procedure Laterality Date   CATARACT EXTRACTION W/PHACO Right 10/21/2018   Procedure: CATARACT EXTRACTION PHACO AND INTRAOCULAR LENS PLACEMENT (IOC) RIGHT VISION BLUE DIABETIC  08.27.0  24.8%  126.0;  Surgeon: Clair Crews, MD;  Location: [facility];  Service: Ophthalmology;  Laterality: Right;  diabetic - oral meds   CATARACT EXTRACTION W/PHACO Left 11/18/2018   Procedure: CATARACT EXTRACTION PHACO AND INTRAOCULAR LENS PLACEMENT (IOC) LEFT DIABETIC 01:18.5     15.8%    12.45;  Surgeon: Clair Crews, MD;  Location: [facility];  Service: Ophthalmology;  Laterality: Left;  DIABETIC   TUBAL LIGATION  1966   Current Outpatient Medications on File Prior to Visit  Medication Sig Dispense Refill   acetaminophen  (TYLENOL ) 500 MG tablet Take 1,000 mg by mouth every 8 (eight) hours as needed for mild pain or moderate pain.      aspirin EC 81 MG tablet Take 81 mg by mouth daily.     atorvastatin  (LIPITOR) 40 MG tablet TAKE 1  TABLET BY MOUTH EVERY DAY 90 tablet 1   azithromycin  (ZITHROMAX ) 250 MG tablet Take one tab a day for 10 days for uri 10 tablet 0   Cholecalciferol (VITAMIN D -3) 1000 UNITS CAPS Take 1,000 Units by mouth daily.     colchicine  0.6 MG tablet TAKE 2 TABS ON THE DAY OF GOUT FLARE, TAKE 1 ADDITIONAL TAB 1 HR AFTER ON THE FIRST DAY. AFTER THE FIRST DAY TAKE 1 TABLET DAILY UNTIL GOUT FLARE RESOLVES. USE AS NEEDED FOR GOUT FLARES. 270 tablet 1   CRANBERRY PO Take 4,200 mg by mouth daily as needed.     ELIQUIS  5 MG TABS tablet TAKE 1 TABLET BY MOUTH TWICE A DAY 60 tablet 3   furosemide  (LASIX ) 20 MG tablet TAKE 1 TABLET BY MOUTH EVERY DAY FOR 3 DAYS FOR SWELLING, THEN AS NEEDED 90 tablet 1   hydrochlorothiazide  (HYDRODIURIL ) 25 MG tablet TAKE 1 TABLET BY MOUTH EVERY DAY 90 tablet 1   hydrOXYzine  (ATARAX ) 10 MG tablet Take 1 tablet (10 mg total) by mouth 3 (three) times daily as needed. 270 tablet 1   JANUVIA  25 MG tablet TAKE 1 TABLET (25 MG TOTAL) BY MOUTH DAILY. 90 tablet 1   Lancets (ONETOUCH DELICA PLUS LANCET33G) MISC USE TO TEST BLOOD SUGAR ONCE DAILY 100 each 3   losartan  (COZAAR ) 25 MG tablet TAKE 1 TABLET (25 MG TOTAL) BY MOUTH DAILY. 90 tablet 1   meloxicam  (MOBIC ) 7.5 MG tablet TAKE 1 TABLET (7.5 MG  TOTAL) BY MOUTH DAILY. AS NEEDED 30 tablet 2   omeprazole  (PRILOSEC) 40 MG capsule TAKE 1 CAPSULE (40 MG TOTAL) BY MOUTH DAILY. 90 capsule 1   ONETOUCH ULTRA test strip USE TO TEST BLOOD SUGAR DAILY 100 strip 3   oxybutynin  (DITROPAN -XL) 5 MG 24 hr tablet TAKE 1 TABLET BY MOUTH EVERYDAY AT BEDTIME 90 tablet 1   Selenium 200 MCG CAPS Take by mouth daily.     TURMERIC PO Take 500 mg by mouth daily.     vitamin B-12 (CYANOCOBALAMIN ) 500 MCG tablet Take 500 mcg by mouth daily.     vitamin E 100 UNIT capsule Take 100 Units by mouth daily.     No current facility-administered medications on file prior to visit.    Allergies  Allergen Reactions   Gabapentin     Upset stomach   Penicillins      Doesn't remember reaction   Rivaroxaban Other (See Comments)    Patient stated she had a lot of blood gushing in her mouth when she brushed her teeth   Social History   Occupational History   Not on file  Tobacco Use   Smoking status: Never   Smokeless tobacco: Never  Vaping Use   Vaping status: Never Used  Substance and Sexual Activity   Alcohol use: Never   Drug use: Never   Sexual activity: Not on file   Family History  Problem Relation Age of Onset   Asthma Sister    Breast cancer Neg Hx    Immunization History  Administered Date(s) Administered   Influenza Inj Mdck Quad Pf 11/08/2020, 11/27/2022   Influenza, High Dose Seasonal PF 11/12/2016, 10/25/2017, 11/06/2018, 12/16/2019, 11/18/2021   Influenza-Unspecified 11/06/2018   PFIZER Comirnaty(Gray Top)Covid-19 Tri-Sucrose Vaccine 02/25/2020   PFIZER(Purple Top)SARS-COV-2 Vaccination 05/02/2019, 05/27/2019   PNEUMOCOCCAL CONJUGATE-20 09/15/2021   Pneumococcal Conjugate-13 11/24/2017   Tdap 09/15/2021   Zoster Recombinant(Shingrix) 08/21/2017, 10/19/2017, 10/25/2017, 11/18/2021     Review of Systems: Negative except as noted in the HPI.   Objective: There were no vitals filed for this visit.  Tracey Love is a pleasant 83 y.o. female in NAD. AAO X 3.  Diabetic foot exam was performed with the following findings:   Vascular Examination: Capillary refill time immediate b/l. Palpable pedal pulses. Pedal hair present b/l. No pain with calf compression b/l. Skin temperature gradient WNL b/l. No cyanosis or clubbing b/l. No ischemia or gangrene noted b/l. No edema noted b/l LE.  Neurological Examination: Sensation grossly intact b/l with 10 gram monofilament. Vibratory sensation intact b/l.   Dermatological Examination: Pedal skin with normal turgor, texture and tone b/l.  No open wounds. No interdigital macerations.   Toenails 1-5 b/l thick, discolored, elongated with subungual debris and pain on dorsal palpation.    No corns, calluses nor porokeratotic lesions noted.  Musculoskeletal Examination: Muscle strength 5/5 to all lower extremity muscle groups bilaterally. No pain, crepitus or joint limitation noted with ROM bilateral LE. Pes planus bilaterally.  Radiographs: None     Lab Results  Component Value Date   HGBA1C 8.0 (A) 07/18/2023   ADA Risk Categorization: Low Risk :  Patient has all of the following: Intact protective sensation No prior foot ulcer  No severe deformity Pedal pulses present  Assessment: 1. Pain due to onychomycosis of toenails of both feet   2. Pes planus of both feet   3. Type 2 diabetes mellitus without complication, without long-term current use of insulin (HCC)   4. Encounter  for diabetic foot exam [facility])    Plan: Consent given for treatment. Diabetic foot examination performed.. All patient's and/or POA's questions/concerns addressed on today's visit. Mycotic toenails 1-5 debrided in length and girth without incident. Continue foot and shoe inspections daily. Monitor blood glucose per PCP/Endocrinologist's recommendations.Continue soft, supportive shoe gear daily. Report any pedal injuries to medical professional. Call office if there are any quesitons/concerns. -Patient/POA to call should there be question/concern in the interim. Return in about 3 months (around 11/02/2023).  Luella Sager, DPM      [facility] LOCATION: [address], [place] [address]                   Office [phone]   [facility] LOCATION: [address] [place], [place] [address] Office [phone]

## 2023-08-16 ENCOUNTER — Ambulatory Visit (INDEPENDENT_AMBULATORY_CARE_PROVIDER_SITE_OTHER): Admitting: Physician Assistant

## 2023-08-16 ENCOUNTER — Encounter: Payer: Self-pay | Admitting: Physician Assistant

## 2023-08-16 VITALS — BP 130/70 | HR 74 | Temp 97.8°F | Resp 16 | Ht 64.0 in | Wt 192.0 lb

## 2023-08-16 DIAGNOSIS — D696 Thrombocytopenia, unspecified: Secondary | ICD-10-CM

## 2023-08-16 DIAGNOSIS — N1832 Chronic kidney disease, stage 3b: Secondary | ICD-10-CM

## 2023-08-16 DIAGNOSIS — E1122 Type 2 diabetes mellitus with diabetic chronic kidney disease: Secondary | ICD-10-CM

## 2023-08-16 DIAGNOSIS — I10 Essential (primary) hypertension: Secondary | ICD-10-CM

## 2023-08-16 MED ORDER — DAPAGLIFLOZIN PROPANEDIOL 5 MG PO TABS: 5.0000 mg | ORAL_TABLET | Freq: Every day | ORAL | 2 refills | Status: DC

## 2023-08-16 NOTE — Progress Notes (Signed)
 [facility] [address] [place], [place] [address]  Internal MEDICINE  Office Visit Note  Patient Name: Tracey Love  [id number]  [id number]  Date of Service: 08/16/2023  Chief Complaint  Patient presents with   Diabetes    HPI Pt is here for routine follow up -BG 170 this morning, taking 75mg  januvia , discussed adding new medication to help further but will have to monitor kidneys still given borderline GFR for initiation of therapy. Pt agreeable with plan -has # to call nephrology to schedule still, also never had US  of abdomen to look at kidneys and liver -BP stable  Current Medication: Outpatient Encounter Medications as of 08/16/2023  Medication Sig   acetaminophen  (TYLENOL ) 500 MG tablet Take 1,000 mg by mouth every 8 (eight) hours as needed for mild pain or moderate pain.    aspirin EC 81 MG tablet Take 81 mg by mouth daily.   atorvastatin  (LIPITOR) 40 MG tablet TAKE 1 TABLET BY MOUTH EVERY DAY   azithromycin  (ZITHROMAX ) 250 MG tablet Take one tab a day for 10 days for uri   Cholecalciferol (VITAMIN D -3) 1000 UNITS CAPS Take 1,000 Units by mouth daily.   colchicine  0.6 MG tablet TAKE 2 TABS ON THE DAY OF GOUT FLARE, TAKE 1 ADDITIONAL TAB 1 HR AFTER ON THE FIRST DAY. AFTER THE FIRST DAY TAKE 1 TABLET DAILY UNTIL GOUT FLARE RESOLVES. USE AS NEEDED FOR GOUT FLARES.   CRANBERRY PO Take 4,200 mg by mouth daily as needed.   dapagliflozin  propanediol (FARXIGA ) 5 MG TABS tablet Take 1 tablet (5 mg total) by mouth daily before breakfast.   furosemide  (LASIX ) 20 MG tablet TAKE 1 TABLET BY MOUTH EVERY DAY FOR 3 DAYS FOR SWELLING, THEN AS NEEDED   hydrochlorothiazide  (HYDRODIURIL ) 25 MG tablet TAKE 1 TABLET BY MOUTH EVERY DAY   hydrOXYzine  (ATARAX ) 10 MG tablet Take 1 tablet (10 mg total) by mouth 3 (three) times daily as needed.   JANUVIA  25 MG tablet TAKE 1 TABLET (25 MG TOTAL) BY MOUTH DAILY.   JANUVIA  50 MG tablet TAKE 1 TABLET BY MOUTH EVERY DAY   losartan  (COZAAR )  25 MG tablet TAKE 1 TABLET (25 MG TOTAL) BY MOUTH DAILY.   meloxicam  (MOBIC ) 7.5 MG tablet TAKE 1 TABLET (7.5 MG TOTAL) BY MOUTH DAILY. AS NEEDED   omeprazole  (PRILOSEC) 40 MG capsule TAKE 1 CAPSULE (40 MG TOTAL) BY MOUTH DAILY.   ONETOUCH ULTRA test strip USE TO TEST BLOOD SUGAR DAILY   oxybutynin  (DITROPAN -XL) 5 MG 24 hr tablet TAKE 1 TABLET BY MOUTH EVERYDAY AT BEDTIME   Selenium 200 MCG CAPS Take by mouth daily.   TURMERIC PO Take 500 mg by mouth daily.   vitamin B-12 (CYANOCOBALAMIN ) 500 MCG tablet Take 500 mcg by mouth daily.   vitamin E 100 UNIT capsule Take 100 Units by mouth daily.   [DISCONTINUED] ELIQUIS  5 MG TABS tablet TAKE 1 TABLET BY MOUTH TWICE A DAY   [DISCONTINUED] Lancets (ONETOUCH DELICA PLUS LANCET33G) MISC USE TO TEST BLOOD SUGAR ONCE DAILY   No facility-administered encounter medications on file as of 08/16/2023.    Surgical History: Past Surgical History:  Procedure Laterality Date   CATARACT EXTRACTION W/PHACO Right 10/21/2018   Procedure: CATARACT EXTRACTION PHACO AND INTRAOCULAR LENS PLACEMENT (IOC) RIGHT VISION BLUE DIABETIC  08.27.0  24.8%  126.0;  Surgeon: Jaye Fallow, MD;  Location: [facility];  Service: Ophthalmology;  Laterality: Right;  diabetic - oral meds   CATARACT EXTRACTION W/PHACO Left  11/18/2018   Procedure: CATARACT EXTRACTION PHACO AND INTRAOCULAR LENS PLACEMENT (IOC) LEFT DIABETIC 01:18.5     15.8%    12.45;  Surgeon: Jaye Fallow, MD;  Location: [facility];  Service: Ophthalmology;  Laterality: Left;  DIABETIC   TUBAL LIGATION  1966    Medical History: Past Medical History:  Diagnosis Date   A-fib (HCC)    Diabetes mellitus, type 2 (HCC)    Gout    feet   Hypertension    MGUS (monoclonal gammopathy of unknown significance)    Osteoporosis    Thyroid  nodule     Family History: Family History  Problem Relation Age of Onset   Asthma Sister    Breast cancer Neg Hx     Social History   Socioeconomic  History   Marital status: Divorced    Spouse name: Not on file   Number of children: Not on file   Years of education: Not on file   Highest education level: Not on file  Occupational History   Not on file  Tobacco Use   Smoking status: Never   Smokeless tobacco: Never  Vaping Use   Vaping status: Never Used  Substance and Sexual Activity   Alcohol use: Never   Drug use: Never   Sexual activity: Not on file  Other Topics Concern   Not on file  Social History Narrative   Not on file   Social Drivers of Health   Financial Resource Strain: Medium Risk (03/27/2023)   Received from [facility]   Overall Financial Resource Strain (CARDIA)    Difficulty of Paying Living Expenses: Somewhat hard  Food Insecurity: Food Insecurity Present (03/27/2023)   Received from [facility]   Hunger Vital Sign    Within the past 12 months, you worried that your food would run out before you got the money to buy more.: Never true    Within the past 12 months, the food you bought just didn't last and you didn't have money to get more.: Sometimes true  Transportation Needs: No Transportation Needs (03/27/2023)   Received from [facility] - Transportation    In the past 12 months, has lack of transportation kept you from medical appointments or from getting medications?: No    Lack of Transportation (Non-Medical): No  Physical Activity: Not on file  Stress: Not on file  Social Connections: Not on file  Intimate Partner Violence: Not on file      Review of Systems  Constitutional:  Negative for chills, fatigue and unexpected weight change.  HENT:  Negative for rhinorrhea, sneezing and sore throat.   Eyes:  Negative for redness.  Respiratory:  Negative for cough, chest tightness, shortness of breath and wheezing.   Cardiovascular:  Negative for chest pain and palpitations.  Gastrointestinal:  Negative for abdominal pain, constipation,  diarrhea, nausea and vomiting.  Genitourinary:  Negative for dysuria and frequency.  Musculoskeletal:  Positive for arthralgias. Negative for back pain, joint swelling and neck pain.  Skin:  Negative for rash.  Neurological: Negative.  Negative for dizziness, tremors, light-headedness and numbness.  Hematological:  Negative for adenopathy. Does not bruise/bleed easily.  Psychiatric/Behavioral:  Negative for behavioral problems (Depression), sleep disturbance and suicidal ideas. The patient is not nervous/anxious.     Vital Signs: BP 130/70 Comment: 148/80  Pulse 74   Temp 97.8 F (36.6 C)   Resp 16   Ht 5' 4 (1.626 m)  Wt 192 lb (87.1 kg)   SpO2 96%   BMI 32.96 kg/m    Physical Exam Vitals and nursing note reviewed.  HENT:     Head: Normocephalic and atraumatic.  Eyes:     Extraocular Movements: Extraocular movements intact.  Cardiovascular:     Rate and Rhythm: Normal rate and regular rhythm.  Pulmonary:     Effort: Pulmonary effort is normal.     Breath sounds: Normal breath sounds.  Skin:    General: Skin is warm and dry.  Neurological:     General: No focal deficit present.  Psychiatric:        Mood and Affect: Mood normal.        Behavior: Behavior normal.        Assessment/Plan: 1. Type 2 diabetes mellitus with stage 3b chronic kidney disease, without long-term current use of insulin (HCC) (Primary) Will start low dose farxiga , but will monitor labs closely given reduced renal function. Pt is supposed to schedule US  of abdomen and nephrology evaluation as well - dapagliflozin  propanediol (FARXIGA ) 5 MG TABS tablet; Take 1 tablet (5 mg total) by mouth daily before breakfast.  Dispense: 30 tablet; Refill: 2 - CMP14+EGFR - CBC w/Diff/Platelet  2. Essential hypertension Stable, continue current medication  3. Low platelet count (HCC) - CBC w/Diff/Platelet    General Counseling: Tracey Love verbalizes understanding of the findings of todays visit and agrees  with plan of treatment. I have discussed any further diagnostic evaluation that may be needed or ordered today. We also reviewed her medications today. she has been encouraged to call the office with any questions or concerns that should arise related to todays visit.    Orders Placed This Encounter  Procedures   CMP14+EGFR   CBC w/Diff/Platelet    Meds ordered this encounter  Medications   dapagliflozin  propanediol (FARXIGA ) 5 MG TABS tablet    Sig: Take 1 tablet (5 mg total) by mouth daily before breakfast.    Dispense:  30 tablet    Refill:  2    This patient was seen by Tinnie Pro, PA-C in collaboration with Dr. Sigrid Bathe as a part of collaborative care agreement.   Total time spent:30 Minutes Time spent includes review of chart, medications, test results, and follow up plan with the patient.      Dr Fozia M Khan Internal medicine

## 2023-08-17 ENCOUNTER — Other Ambulatory Visit: Payer: Self-pay | Admitting: Physician Assistant

## 2023-08-20 ENCOUNTER — Other Ambulatory Visit: Payer: Self-pay

## 2023-08-20 DIAGNOSIS — N1831 Chronic kidney disease, stage 3a: Secondary | ICD-10-CM

## 2023-08-20 MED ORDER — ONETOUCH DELICA PLUS LANCET33G MISC: 3 refills | Status: DC

## 2023-08-20 MED ORDER — APIXABAN 5 MG PO TABS: 5.0000 mg | ORAL_TABLET | Freq: Two times a day (BID) | ORAL | 3 refills | Status: DC

## 2023-09-03 ENCOUNTER — Telehealth: Payer: Self-pay

## 2023-09-03 NOTE — Telephone Encounter (Signed)
 Notified patient that Tinnie has ordered repeat labs for kidney/platelets to be done prior to next f/u appt on 09/27/23.

## 2023-09-05 ENCOUNTER — Other Ambulatory Visit: Payer: Self-pay | Admitting: Physician Assistant

## 2023-09-05 NOTE — Telephone Encounter (Signed)
 Just FYI.

## 2023-09-09 DIAGNOSIS — N1832 Chronic kidney disease, stage 3b: Secondary | ICD-10-CM | POA: Diagnosis not present

## 2023-09-09 DIAGNOSIS — E1122 Type 2 diabetes mellitus with diabetic chronic kidney disease: Secondary | ICD-10-CM | POA: Diagnosis not present

## 2023-09-09 DIAGNOSIS — D696 Thrombocytopenia, unspecified: Secondary | ICD-10-CM | POA: Diagnosis not present

## 2023-09-10 LAB — CMP14+EGFR
ALT: 35 IU/L — ABNORMAL HIGH (ref 0–32)
AST: 34 IU/L (ref 0–40)
Albumin: 4.4 g/dL (ref 3.7–4.7)
Alkaline Phosphatase: 168 IU/L — ABNORMAL HIGH (ref 44–121)
BUN/Creatinine Ratio: 20 (ref 12–28)
BUN: 29 mg/dL — ABNORMAL HIGH (ref 8–27)
Bilirubin Total: 0.6 mg/dL (ref 0.0–1.2)
CO2: 23 mmol/L (ref 20–29)
Calcium: 10.7 mg/dL — ABNORMAL HIGH (ref 8.7–10.3)
Chloride: 98 mmol/L (ref 96–106)
Creatinine, Ser: 1.45 mg/dL — ABNORMAL HIGH (ref 0.57–1.00)
Globulin, Total: 3.7 g/dL (ref 1.5–4.5)
Glucose: 182 mg/dL — ABNORMAL HIGH (ref 70–99)
Potassium: 4.5 mmol/L (ref 3.5–5.2)
Sodium: 140 mmol/L (ref 134–144)
Total Protein: 8.1 g/dL (ref 6.0–8.5)
eGFR: 36 mL/min/1.73 — ABNORMAL LOW (ref >59)

## 2023-09-10 LAB — CBC WITH DIFFERENTIAL/PLATELET
Basophils Absolute: 0 x10E3/uL (ref 0.0–0.2)
Basos: 0 %
EOS (ABSOLUTE): 0.1 x10E3/uL (ref 0.0–0.4)
Eos: 1 %
Hematocrit: 39.7 % (ref 34.0–46.6)
Hemoglobin: 13.4 g/dL (ref 11.1–15.9)
Immature Grans (Abs): 0 x10E3/uL (ref 0.0–0.1)
Immature Granulocytes: 0 %
Lymphocytes Absolute: 1.8 x10E3/uL (ref 0.7–3.1)
Lymphs: 34 %
MCH: 35.9 pg — ABNORMAL HIGH (ref 26.6–33.0)
MCHC: 33.8 g/dL (ref 31.5–35.7)
MCV: 106 fL — ABNORMAL HIGH (ref 79–97)
Monocytes Absolute: 0.5 x10E3/uL (ref 0.1–0.9)
Monocytes: 10 %
Neutrophils Absolute: 2.9 x10E3/uL (ref 1.4–7.0)
Neutrophils: 55 %
Platelets: 120 x10E3/uL — ABNORMAL LOW (ref 150–450)
RBC: 3.73 x10E6/uL — ABNORMAL LOW (ref 3.77–5.28)
RDW: 12.7 % (ref 11.7–15.4)
WBC: 5.3 x10E3/uL (ref 3.4–10.8)

## 2023-09-27 ENCOUNTER — Telehealth: Payer: Self-pay

## 2023-09-27 ENCOUNTER — Telehealth: Payer: Self-pay | Admitting: Physician Assistant

## 2023-09-27 ENCOUNTER — Encounter: Payer: Self-pay | Admitting: Physician Assistant

## 2023-09-27 ENCOUNTER — Ambulatory Visit: Admitting: Physician Assistant

## 2023-09-27 VITALS — BP 142/74 | HR 74 | Temp 98.1°F | Resp 16 | Ht 64.0 in | Wt 189.4 lb

## 2023-09-27 DIAGNOSIS — R7989 Other specified abnormal findings of blood chemistry: Secondary | ICD-10-CM | POA: Diagnosis not present

## 2023-09-27 DIAGNOSIS — I10 Essential (primary) hypertension: Secondary | ICD-10-CM

## 2023-09-27 DIAGNOSIS — E1122 Type 2 diabetes mellitus with diabetic chronic kidney disease: Secondary | ICD-10-CM

## 2023-09-27 DIAGNOSIS — D696 Thrombocytopenia, unspecified: Secondary | ICD-10-CM | POA: Diagnosis not present

## 2023-09-27 DIAGNOSIS — N1832 Chronic kidney disease, stage 3b: Secondary | ICD-10-CM

## 2023-09-27 DIAGNOSIS — D472 Monoclonal gammopathy: Secondary | ICD-10-CM | POA: Diagnosis not present

## 2023-09-27 NOTE — Progress Notes (Signed)
 [facility] [address] [place], [place] [address]  Internal MEDICINE  Office Visit Note  Patient Name: Tracey Love  [id number]  [id number]  Date of Service: 10/22/2023  Chief Complaint  Patient presents with   Follow-up   Diabetes    Did not like Farxiga , too many side effects.   Hypertension   Hand Pain    Finger inflammation    HPI Pt is here for routine follow up -Only took 1 tab farxiga  and states she couldn't function on this. Still just taking 75mg  Januvia . Will add glimepiride  but cautioned on lows and to take with a meal.  -nephrology on 9/22 -will reorder abdominal US  again to check both renal and liver functions given abnormal labs. May need hematology again--hx MGUS and told no further follow up needed, but labs continue to be abnormal and may need re-evaluation -does have significant joint pain, in hands and knees. Sees ortho for her knees. Advised to avoid mobic /NSAIDs as much as possible due to renal impairment -BP stable at home  Current Medication: Outpatient Encounter Medications as of 09/27/2023  Medication Sig   acetaminophen  (TYLENOL ) 500 MG tablet Take 1,000 mg by mouth every 8 (eight) hours as needed for mild pain or moderate pain.    apixaban  (ELIQUIS ) 5 MG TABS tablet Take 1 tablet (5 mg total) by mouth 2 (two) times daily.   aspirin EC 81 MG tablet Take 81 mg by mouth daily.   atorvastatin  (LIPITOR) 40 MG tablet TAKE 1 TABLET BY MOUTH EVERY DAY   Cholecalciferol (VITAMIN D -3) 1000 UNITS CAPS Take 1,000 Units by mouth daily.   colchicine  0.6 MG tablet TAKE 2 TABS ON THE DAY OF GOUT FLARE, TAKE 1 ADDITIONAL TAB 1 HR AFTER ON THE FIRST DAY. AFTER THE FIRST DAY TAKE 1 TABLET DAILY UNTIL GOUT FLARE RESOLVES. USE AS NEEDED FOR GOUT FLARES.   CRANBERRY PO Take 4,200 mg by mouth daily as needed.   furosemide  (LASIX ) 20 MG tablet TAKE 1 TABLET BY MOUTH EVERY DAY FOR 3 DAYS FOR SWELLING, THEN AS NEEDED   glimepiride  (AMARYL ) 1 MG tablet Take 1 tablet  (1 mg total) by mouth daily with breakfast.   hydrochlorothiazide  (HYDRODIURIL ) 25 MG tablet TAKE 1 TABLET BY MOUTH EVERY DAY   hydrOXYzine  (ATARAX ) 10 MG tablet Take 1 tablet (10 mg total) by mouth 3 (three) times daily as needed.   JANUVIA  50 MG tablet TAKE 1 TABLET BY MOUTH EVERY DAY   meloxicam  (MOBIC ) 7.5 MG tablet TAKE 1 TABLET (7.5 MG TOTAL) BY MOUTH DAILY. AS NEEDED   omeprazole  (PRILOSEC) 40 MG capsule TAKE 1 CAPSULE (40 MG TOTAL) BY MOUTH DAILY.   ONETOUCH ULTRA test strip USE TO TEST BLOOD SUGAR DAILY   oxybutynin  (DITROPAN -XL) 5 MG 24 hr tablet TAKE 1 TABLET BY MOUTH EVERYDAY AT BEDTIME   Selenium 200 MCG CAPS Take by mouth daily.   sitaGLIPtin  (JANUVIA ) 25 MG tablet Take 1 tablet (25 mg total) by mouth daily. To be taken with 50mg  tablet for a total of 75mg  daily.   TURMERIC PO Take 500 mg by mouth daily.   vitamin B-12 (CYANOCOBALAMIN ) 500 MCG tablet Take 500 mcg by mouth daily.   vitamin E 100 UNIT capsule Take 100 Units by mouth daily.   [DISCONTINUED] azithromycin  (ZITHROMAX ) 250 MG tablet Take one tab a day for 10 days for uri   [DISCONTINUED] dapagliflozin  propanediol (FARXIGA ) 5 MG TABS tablet Take 1 tablet (5 mg total) by mouth daily before breakfast.   [  DISCONTINUED] Lancets (ONETOUCH DELICA PLUS LANCET33G) MISC USE TO TEST BLOOD SUGAR ONCE DAILY   [DISCONTINUED] losartan  (COZAAR ) 25 MG tablet TAKE 1 TABLET (25 MG TOTAL) BY MOUTH DAILY.   No facility-administered encounter medications on file as of 09/27/2023.    Surgical History: Past Surgical History:  Procedure Laterality Date   CATARACT EXTRACTION W/PHACO Right 10/21/2018   Procedure: CATARACT EXTRACTION PHACO AND INTRAOCULAR LENS PLACEMENT (IOC) RIGHT VISION BLUE DIABETIC  08.27.0  24.8%  126.0;  Surgeon: Jaye Fallow, MD;  Location: [facility];  Service: Ophthalmology;  Laterality: Right;  diabetic - oral meds   CATARACT EXTRACTION W/PHACO Left 11/18/2018   Procedure: CATARACT EXTRACTION PHACO AND  INTRAOCULAR LENS PLACEMENT (IOC) LEFT DIABETIC 01:18.5     15.8%    12.45;  Surgeon: Jaye Fallow, MD;  Location: [facility];  Service: Ophthalmology;  Laterality: Left;  DIABETIC   TUBAL LIGATION  1966    Medical History: Past Medical History:  Diagnosis Date   A-fib (HCC)    Diabetes mellitus, type 2 (HCC)    Gout    feet   Hypertension    MGUS (monoclonal gammopathy of unknown significance)    Osteoporosis    Thyroid  nodule     Family History: Family History  Problem Relation Age of Onset   Asthma Sister    Breast cancer Neg Hx     Social History   Socioeconomic History   Marital status: Divorced    Spouse name: Not on file   Number of children: Not on file   Years of education: Not on file   Highest education level: Not on file  Occupational History   Not on file  Tobacco Use   Smoking status: Never   Smokeless tobacco: Never  Vaping Use   Vaping status: Never Used  Substance and Sexual Activity   Alcohol use: Never   Drug use: Never   Sexual activity: Not on file  Other Topics Concern   Not on file  Social History Narrative   Not on file   Social Drivers of Health   Financial Resource Strain: Medium Risk (03/27/2023)   Received from [facility]   Overall Financial Resource Strain (CARDIA)    Difficulty of Paying Living Expenses: Somewhat hard  Food Insecurity: Food Insecurity Present (03/27/2023)   Received from [facility]   Hunger Vital Sign    Within the past 12 months, you worried that your food would run out before you got the money to buy more.: Never true    Within the past 12 months, the food you bought just didn't last and you didn't have money to get more.: Sometimes true  Transportation Needs: No Transportation Needs (03/27/2023)   Received [facility]    In the past 12 months, has lack of transportation kept you from medical appointments or from  getting medications?: No    Lack of Transportation (Non-Medical): No  Physical Activity: Not on file  Stress: Not on file  Social Connections: Not on file  Intimate Partner Violence: Not on file      Review of Systems  Constitutional:  Negative for chills, fatigue and unexpected weight change.  HENT:  Negative for rhinorrhea, sneezing and sore throat.   Eyes:  Negative for redness.  Respiratory:  Negative for cough, chest tightness, shortness of breath and wheezing.   Cardiovascular:  Negative for chest pain and palpitations.  Gastrointestinal:  Negative  for abdominal pain, constipation, diarrhea, nausea and vomiting.  Genitourinary:  Negative for dysuria and frequency.  Musculoskeletal:  Positive for arthralgias. Negative for back pain, joint swelling and neck pain.  Skin:  Negative for rash.  Neurological: Negative.  Negative for dizziness, tremors, light-headedness and numbness.  Hematological:  Negative for adenopathy. Does not bruise/bleed easily.  Psychiatric/Behavioral:  Negative for behavioral problems (Depression), sleep disturbance and suicidal ideas. The patient is not nervous/anxious.     Vital Signs: BP (!) 142/74 Comment: 170/91  Pulse 74   Temp 98.1 F (36.7 C)   Resp 16   Ht 5' 4 (1.626 m)   Wt 189 lb 6.4 oz (85.9 kg)   SpO2 98%   BMI 32.51 kg/m    Physical Exam Vitals and nursing note reviewed.  HENT:     Head: Normocephalic and atraumatic.  Eyes:     Extraocular Movements: Extraocular movements intact.  Cardiovascular:     Rate and Rhythm: Normal rate and regular rhythm.  Pulmonary:     Effort: Pulmonary effort is normal.     Breath sounds: Normal breath sounds.  Skin:    General: Skin is warm and dry.  Neurological:     General: No focal deficit present.  Psychiatric:        Mood and Affect: Mood normal.        Behavior: Behavior normal.        Assessment/Plan: 1. Type 2 diabetes mellitus with stage 3b chronic kidney disease, without  long-term current use of insulin (HCC) (Primary) Unable to tolerate Faxiga, will add glimepiride  1 tab  daily with a meal and monitor - Urine Microalbumin w/creat. ratio - glimepiride  (AMARYL ) 1 MG tablet; Take 1 tablet (1 mg total) by mouth daily with breakfast.  Dispense: 30 tablet; Refill: 2  2. Essential hypertension Borderline in office, normally well controlled at home  3. Stage 3b chronic kidney disease (HCC) Will reorder US  to evaluate kidneys and liver. Will also monitor labs. Pt has appt with nephrology next month - US  Abdomen Complete; Future - Comprehensive metabolic panel with GFR  4. Low platelet count (HCC) Will check US  and labs - US  Abdomen Complete; Future - CBC w/Diff/Platelet  5. Elevated LFTs - US  Abdomen Complete; Future - Comprehensive metabolic panel with GFR  6. MGUS (monoclonal gammopathy of unknown significance) May need to consider follow up with hematology   General Counseling: tenasia aull understanding of the findings of todays visit and agrees with plan of treatment. I have discussed any further diagnostic evaluation that may be needed or ordered today. We also reviewed her medications today. she has been encouraged to call the office with any questions or concerns that should arise related to todays visit.    Orders Placed This Encounter  Procedures   US  Abdomen Complete   Urine Microalbumin w/creat. ratio   Comprehensive metabolic panel with GFR   CBC w/Diff/Platelet    Meds ordered this encounter  Medications   glimepiride  (AMARYL ) 1 MG tablet    Sig: Take 1 tablet (1 mg total) by mouth daily with breakfast.    Dispense:  30 tablet    Refill:  2    This patient was seen by Tinnie Pro, PA-C in collaboration with Dr. Sigrid Bathe as a part of collaborative care agreement.   Total time spent:30 Minutes Time spent includes review of chart, medications, test results, and follow up plan with the patient.      Dr Sigrid CHRISTELLA Bathe Internal  medicine

## 2023-09-27 NOTE — Telephone Encounter (Signed)
 Notified patient of U/S appointment date, arrival time, location-[place]

## 2023-09-27 NOTE — Progress Notes (Signed)
   09/27/2023  Patient ID: Tracey Love, female   DOB: Oct 07, 1940, 83 y.o.   MRN: [id number]  This patient is appearing on a report for being at risk of failing the adherence measure for identified medications this calendar year.   Medication Adherence Summary (STAR/HEDIS Monitoring): Adherence Category: hypertension (ACEi/ARB)    Drug Name: Lisinopril  40 mg Sold Date:06/231/2025 Days' Supply: 90     Notes: ? Adherence data pulled from pharmacy claims portal Dr. Annemarie. ? Reviewed barriers to adherence: N/A. ? Plan: Follow prior to next fill   Dorcas Solian, PharmD Clinical Pharmacist Cell: [phone]

## 2023-09-29 LAB — MICROALBUMIN / CREATININE URINE RATIO
Creatinine, Urine: 166.6 mg/dL
Microalb/Creat Ratio: 20 mg/g{creat} (ref 0–29)
Microalbumin, Urine: 33 ug/mL

## 2023-09-30 MED ORDER — GLIMEPIRIDE 1 MG PO TABS: 1.0000 mg | ORAL_TABLET | Freq: Every day | ORAL | 2 refills | Status: DC

## 2023-10-02 ENCOUNTER — Ambulatory Visit

## 2023-10-08 ENCOUNTER — Other Ambulatory Visit: Payer: Self-pay | Admitting: Physician Assistant

## 2023-10-08 DIAGNOSIS — N1831 Type 2 diabetes mellitus with diabetic chronic kidney disease: Secondary | ICD-10-CM

## 2023-10-08 DIAGNOSIS — D696 Thrombocytopenia, unspecified: Secondary | ICD-10-CM | POA: Diagnosis not present

## 2023-10-08 DIAGNOSIS — R7989 Other specified abnormal findings of blood chemistry: Secondary | ICD-10-CM | POA: Diagnosis not present

## 2023-10-08 DIAGNOSIS — N1832 Chronic kidney disease, stage 3b: Secondary | ICD-10-CM | POA: Diagnosis not present

## 2023-10-09 ENCOUNTER — Telehealth: Payer: Self-pay

## 2023-10-09 ENCOUNTER — Other Ambulatory Visit: Payer: Self-pay

## 2023-10-09 LAB — CBC WITH DIFFERENTIAL/PLATELET
Basophils Absolute: 0 x10E3/uL (ref 0.0–0.2)
Basos: 1 %
EOS (ABSOLUTE): 0.1 x10E3/uL (ref 0.0–0.4)
Eos: 2 %
Hematocrit: 41.6 % (ref 34.0–46.6)
Hemoglobin: 14 g/dL (ref 11.1–15.9)
Immature Grans (Abs): 0 x10E3/uL (ref 0.0–0.1)
Immature Granulocytes: 0 %
Lymphocytes Absolute: 2.7 x10E3/uL (ref 0.7–3.1)
Lymphs: 48 %
MCH: 35.7 pg — ABNORMAL HIGH (ref 26.6–33.0)
MCHC: 33.7 g/dL (ref 31.5–35.7)
MCV: 106 fL — ABNORMAL HIGH (ref 79–97)
Monocytes Absolute: 0.6 x10E3/uL (ref 0.1–0.9)
Monocytes: 10 %
Neutrophils Absolute: 2.1 x10E3/uL (ref 1.4–7.0)
Neutrophils: 39 %
Platelets: 117 x10E3/uL — ABNORMAL LOW (ref 150–450)
RBC: 3.92 x10E6/uL (ref 3.77–5.28)
RDW: 12.8 % (ref 11.7–15.4)
WBC: 5.5 x10E3/uL (ref 3.4–10.8)

## 2023-10-09 LAB — COMPREHENSIVE METABOLIC PANEL WITH GFR
ALT: 31 IU/L (ref 0–32)
AST: 40 IU/L (ref 0–40)
Albumin: 4.9 g/dL — ABNORMAL HIGH (ref 3.7–4.7)
Alkaline Phosphatase: 151 IU/L — ABNORMAL HIGH (ref 44–121)
BUN/Creatinine Ratio: 12 (ref 12–28)
BUN: 17 mg/dL (ref 8–27)
Bilirubin Total: 0.8 mg/dL (ref 0.0–1.2)
CO2: 22 mmol/L (ref 20–29)
Calcium: 10.9 mg/dL — ABNORMAL HIGH (ref 8.7–10.3)
Chloride: 94 mmol/L — ABNORMAL LOW (ref 96–106)
Creatinine, Ser: 1.39 mg/dL — ABNORMAL HIGH (ref 0.57–1.00)
Globulin, Total: 3.8 g/dL (ref 1.5–4.5)
Glucose: 165 mg/dL — ABNORMAL HIGH (ref 70–99)
Potassium: 4.3 mmol/L (ref 3.5–5.2)
Sodium: 138 mmol/L (ref 134–144)
Total Protein: 8.7 g/dL — ABNORMAL HIGH (ref 6.0–8.5)
eGFR: 38 mL/min/1.73 — ABNORMAL LOW (ref >59)

## 2023-10-09 MED ORDER — ACCU-CHEK GUIDE TEST VI STRP: 1.0000 | ORAL_STRIP | Freq: Every day | 1 refills | Status: DC

## 2023-10-09 MED ORDER — ACCU-CHEK GUIDE ME W/DEVICE KIT: PACK | 0 refills | Status: DC

## 2023-10-09 MED ORDER — ACCU-CHEK SOFTCLIX LANCETS MISC: 1 refills | Status: DC

## 2023-10-09 NOTE — Telephone Encounter (Signed)
 Try to call pt  no voicemail and no respond that her phar sent us  that [employer] no longer covered accu chek guide sent

## 2023-10-16 ENCOUNTER — Telehealth: Payer: Self-pay | Admitting: Physician Assistant

## 2023-10-19 ENCOUNTER — Other Ambulatory Visit: Payer: Self-pay | Admitting: Physician Assistant

## 2023-11-04 ENCOUNTER — Telehealth: Payer: Self-pay | Admitting: Physician Assistant

## 2023-11-04 NOTE — Telephone Encounter (Signed)
 Per patient request, recent lab results faxed to [facility]; [place] location; [phone]

## 2023-11-07 ENCOUNTER — Ambulatory Visit: Admitting: Podiatry

## 2023-11-11 DIAGNOSIS — M79672 Pain in left foot: Secondary | ICD-10-CM | POA: Diagnosis not present

## 2023-11-11 DIAGNOSIS — M79671 Pain in right foot: Secondary | ICD-10-CM | POA: Diagnosis not present

## 2023-11-11 DIAGNOSIS — E1165 Type 2 diabetes mellitus with hyperglycemia: Secondary | ICD-10-CM | POA: Diagnosis not present

## 2023-11-11 DIAGNOSIS — I10 Essential (primary) hypertension: Secondary | ICD-10-CM | POA: Diagnosis not present

## 2023-11-11 DIAGNOSIS — D7589 Other specified diseases of blood and blood-forming organs: Secondary | ICD-10-CM | POA: Diagnosis not present

## 2023-11-11 DIAGNOSIS — N1832 Chronic kidney disease, stage 3b: Secondary | ICD-10-CM | POA: Diagnosis not present

## 2023-11-11 DIAGNOSIS — D472 Monoclonal gammopathy: Secondary | ICD-10-CM | POA: Diagnosis not present

## 2023-11-11 DIAGNOSIS — I4891 Unspecified atrial fibrillation: Secondary | ICD-10-CM | POA: Diagnosis not present

## 2023-11-15 DIAGNOSIS — N1832 Chronic kidney disease, stage 3b: Secondary | ICD-10-CM | POA: Diagnosis not present

## 2023-11-17 ENCOUNTER — Other Ambulatory Visit: Payer: Self-pay | Admitting: Physician Assistant

## 2023-11-17 DIAGNOSIS — E1122 Type 2 diabetes mellitus with diabetic chronic kidney disease: Secondary | ICD-10-CM

## 2023-11-26 NOTE — Telephone Encounter (Signed)
 error

## 2023-11-29 ENCOUNTER — Ambulatory Visit: Admitting: Physician Assistant

## 2023-11-29 ENCOUNTER — Encounter: Payer: Self-pay | Admitting: Physician Assistant

## 2023-11-29 VITALS — BP 176/101 | HR 81 | Temp 98.4°F | Resp 16 | Ht 64.0 in | Wt 189.0 lb

## 2023-11-29 DIAGNOSIS — I10 Essential (primary) hypertension: Secondary | ICD-10-CM

## 2023-11-29 DIAGNOSIS — I071 Rheumatic tricuspid insufficiency: Secondary | ICD-10-CM

## 2023-11-29 DIAGNOSIS — I340 Nonrheumatic mitral (valve) insufficiency: Secondary | ICD-10-CM

## 2023-11-29 DIAGNOSIS — E1122 Type 2 diabetes mellitus with diabetic chronic kidney disease: Secondary | ICD-10-CM

## 2023-11-29 DIAGNOSIS — N1832 Chronic kidney disease, stage 3b: Secondary | ICD-10-CM | POA: Diagnosis not present

## 2023-11-29 DIAGNOSIS — L299 Pruritus, unspecified: Secondary | ICD-10-CM

## 2023-11-29 DIAGNOSIS — I480 Paroxysmal atrial fibrillation: Secondary | ICD-10-CM

## 2023-11-29 DIAGNOSIS — J0100 Acute maxillary sinusitis, unspecified: Secondary | ICD-10-CM

## 2023-11-29 DIAGNOSIS — N1831 Chronic kidney disease, stage 3a: Secondary | ICD-10-CM

## 2023-11-29 DIAGNOSIS — L989 Disorder of the skin and subcutaneous tissue, unspecified: Secondary | ICD-10-CM

## 2023-11-29 LAB — POCT GLYCOSYLATED HEMOGLOBIN (HGB A1C): Hemoglobin A1C: 7.6 % — AB (ref 4.0–5.6)

## 2023-11-29 MED ORDER — AZITHROMYCIN 250 MG PO TABS: ORAL_TABLET | ORAL | 0 refills | Status: AC

## 2023-11-29 MED ORDER — HYDRALAZINE HCL 10 MG PO TABS: 10.0000 mg | ORAL_TABLET | Freq: Two times a day (BID) | ORAL | 2 refills | Status: AC

## 2023-11-29 MED ORDER — SITAGLIPTIN PHOSPHATE 50 MG PO TABS: 50.0000 mg | ORAL_TABLET | Freq: Every day | ORAL | 1 refills | Status: AC

## 2023-11-29 MED ORDER — HYDROXYZINE HCL 10 MG PO TABS: 10.0000 mg | ORAL_TABLET | Freq: Three times a day (TID) | ORAL | 1 refills | Status: DC | PRN

## 2023-11-29 NOTE — Progress Notes (Signed)
 [facility] [address] [place], [place] [address]  Internal MEDICINE  Office Visit Note  Patient Name: Tracey Love  [id number]  [id number]  Date of Service: 11/29/2023  Chief Complaint  Patient presents with   Follow-up   Diabetes   Hypertension   Quality Metric Gaps    Eye Exam    HPI Pt is here for routine follow up -Ear pain and sore throat, cough for a few weeks. Sinus pressure.  -saw nephrology, discussed med changes to limit renal impact. Has follow up in a few weeks -BP very elevated today -Pt requests med list with indication explained--discussed in detail with indication  written out next to each med -also recommended to get back in with cardiology for re-evaluation and pt is ok with this. Has been many years since she was seen. -will see podiatry -would like dermatology referral to evaluation skin lesions and itchiness  Current Medication: Outpatient Encounter Medications as of 11/29/2023  Medication Sig   Accu-Chek Softclix Lancets lancets Use as instructed once a daily Dx E11.65   acetaminophen  (TYLENOL ) 500 MG tablet Take 1,000 mg by mouth every 8 (eight) hours as needed for mild pain or moderate pain.    atorvastatin  (LIPITOR) 40 MG tablet TAKE 1 TABLET BY MOUTH EVERY DAY   [EXPIRED] azithromycin  (ZITHROMAX ) 250 MG tablet Take 2 tablets on day 1, then 1 tablet daily on days 2 through 5   Blood Glucose Monitoring Suppl (ACCU-CHEK GUIDE ME) w/Device KIT Use as directed once daily DX E11.65   Cholecalciferol (VITAMIN D -3) 1000 UNITS CAPS Take 1,000 Units by mouth daily.   CRANBERRY PO Take 4,200 mg by mouth daily as needed.   furosemide  (LASIX ) 20 MG tablet TAKE 1 TABLET BY MOUTH EVERY DAY FOR 3 DAYS FOR SWELLING, THEN AS NEEDED   glimepiride  (AMARYL ) 1 MG tablet TAKE 1 TABLET BY MOUTH DAILY WITH BREAKFAST. (Patient not taking: Reported on 12/12/2023)   glucose blood (ACCU-CHEK GUIDE TEST) test strip 1 each by Other route daily at 12 noon. DX  E11.65   hydrALAZINE (APRESOLINE) 10 MG tablet Take 1 tablet (10 mg total) by mouth 2 (two) times daily.   losartan  (COZAAR ) 25 MG tablet TAKE 1 TABLET (25 MG TOTAL) BY MOUTH DAILY.   ONETOUCH ULTRA test strip USE TO TEST BLOOD SUGAR DAILY   oxybutynin  (DITROPAN -XL) 5 MG 24 hr tablet TAKE 1 TABLET BY MOUTH EVERYDAY AT BEDTIME   sitaGLIPtin  (JANUVIA ) 25 MG tablet Take 1 tablet (25 mg total) by mouth daily. To be taken with 50mg  tablet for a total of 75mg  daily.   TURMERIC PO Take 500 mg by mouth daily.   vitamin B-12 (CYANOCOBALAMIN ) 500 MCG tablet Take 500 mcg by mouth daily.   vitamin E 100 UNIT capsule Take 100 Units by mouth daily.   [DISCONTINUED] apixaban  (ELIQUIS ) 5 MG TABS tablet Take 1 tablet (5 mg total) by mouth 2 (two) times daily.   [DISCONTINUED] aspirin EC 81 MG tablet Take 81 mg by mouth daily.   [DISCONTINUED] colchicine  0.6 MG tablet TAKE 2 TABS ON THE DAY OF GOUT FLARE, TAKE 1 ADDITIONAL TAB 1 HR AFTER ON THE FIRST DAY. AFTER THE FIRST DAY TAKE 1 TABLET DAILY UNTIL GOUT FLARE RESOLVES. USE AS NEEDED FOR GOUT FLARES.   [DISCONTINUED] hydrochlorothiazide  (HYDRODIURIL ) 25 MG tablet TAKE 1 TABLET BY MOUTH EVERY DAY   [DISCONTINUED] hydrOXYzine  (ATARAX ) 10 MG tablet Take 1 tablet (10 mg total) by mouth 3 (three) times daily as needed.   [  DISCONTINUED] JANUVIA  50 MG tablet TAKE 1 TABLET BY MOUTH EVERY DAY   [DISCONTINUED] meloxicam  (MOBIC ) 7.5 MG tablet TAKE 1 TABLET (7.5 MG TOTAL) BY MOUTH DAILY. AS NEEDED   [DISCONTINUED] omeprazole  (PRILOSEC) 40 MG capsule TAKE 1 CAPSULE (40 MG TOTAL) BY MOUTH DAILY.   [DISCONTINUED] Selenium 200 MCG CAPS Take by mouth daily.   hydrOXYzine  (ATARAX ) 10 MG tablet Take 1 tablet (10 mg total) by mouth 3 (three) times daily as needed.   sitaGLIPtin  (JANUVIA ) 50 MG tablet Take 1 tablet (50 mg total) by mouth daily.   No facility-administered encounter medications on file as of 11/29/2023.    Surgical History: Past Surgical History:  Procedure  Laterality Date   CATARACT EXTRACTION W/PHACO Right 10/21/2018   Procedure: CATARACT EXTRACTION PHACO AND INTRAOCULAR LENS PLACEMENT (IOC) RIGHT VISION BLUE DIABETIC  08.27.0  24.8%  126.0;  Surgeon: Jaye Fallow, MD;  Location: [facility];  Service: Ophthalmology;  Laterality: Right;  diabetic - oral meds   CATARACT EXTRACTION W/PHACO Left 11/18/2018   Procedure: CATARACT EXTRACTION PHACO AND INTRAOCULAR LENS PLACEMENT (IOC) LEFT DIABETIC 01:18.5     15.8%    12.45;  Surgeon: Jaye Fallow, MD;  Location: [facility];  Service: Ophthalmology;  Laterality: Left;  DIABETIC   TUBAL LIGATION  1966    Medical History: Past Medical History:  Diagnosis Date   A-fib (HCC)    Diabetes mellitus, type 2 (HCC)    Gout    feet   Hypertension    MGUS (monoclonal gammopathy of unknown significance)    Osteoporosis    Thyroid  nodule     Family History: Family History  Problem Relation Age of Onset   Asthma Sister    Breast cancer Neg Hx     Social History   Socioeconomic History   Marital status: Divorced    Spouse name: Not on file   Number of children: Not on file   Years of education: Not on file   Highest education level: Not on file  Occupational History   Not on file  Tobacco Use   Smoking status: Never   Smokeless tobacco: Never  Vaping Use   Vaping status: Never Used  Substance and Sexual Activity   Alcohol use: Never   Drug use: Never   Sexual activity: Not on file  Other Topics Concern   Not on file  Social History Narrative   Not on file   Social Drivers of Health   Financial Resource Strain: Medium Risk (03/27/2023)   Received from [facility]   Overall Financial Resource Strain (CARDIA)    Difficulty of Paying Living Expenses: Somewhat hard  Food Insecurity: Food Insecurity Present (03/27/2023)   Received from [facility]   Hunger Vital Sign    Within the past 12 months, you worried that your food would  run out before you got the money to buy more.: Never true    Within the past 12 months, the food you bought just didn't last and you didn't have money to get more.: Sometimes true  Transportation Needs: No Transportation Needs (03/27/2023)   Received from [facility] - Transportation    In the past 12 months, has lack of transportation kept you from medical appointments or from getting medications?: No    Lack of Transportation (Non-Medical): No  Physical Activity: Not on file  Stress: Not on file  Social Connections: Not on file  Intimate Partner Violence: Not  on file      Review of Systems  Constitutional:  Negative for chills, fatigue and unexpected weight change.  HENT:  Negative for rhinorrhea, sneezing and sore throat.   Eyes:  Negative for redness.  Respiratory:  Negative for cough, chest tightness, shortness of breath and wheezing.   Cardiovascular:  Negative for chest pain and palpitations.  Gastrointestinal:  Negative for abdominal pain, constipation, diarrhea, nausea and vomiting.  Genitourinary:  Negative for dysuria and frequency.  Musculoskeletal:  Positive for arthralgias. Negative for back pain, joint swelling and neck pain.  Skin:  Negative for rash.  Neurological: Negative.  Negative for dizziness, tremors, light-headedness and numbness.  Hematological:  Negative for adenopathy. Does not bruise/bleed easily.  Psychiatric/Behavioral:  Negative for behavioral problems (Depression), sleep disturbance and suicidal ideas. The patient is not nervous/anxious.     Vital Signs: BP (!) 176/101   Pulse 81   Temp 98.4 F (36.9 C)   Resp 16   Ht 5' 4 (1.626 m)   Wt 189 lb (85.7 kg)   SpO2 99%   BMI 32.44 kg/m    Physical Exam Vitals and nursing note reviewed.  HENT:     Head: Normocephalic and atraumatic.  Eyes:     Extraocular Movements: Extraocular movements intact.  Cardiovascular:     Rate and Rhythm: Normal rate and regular  rhythm.  Pulmonary:     Effort: Pulmonary effort is normal.     Breath sounds: Normal breath sounds.  Skin:    General: Skin is warm and dry.  Neurological:     General: No focal deficit present.  Psychiatric:        Mood and Affect: Mood normal.        Behavior: Behavior normal.        Assessment/Plan: 1. Essential hypertension (Primary) Will stop hydrochlorothiazide  and start hydralazine BID, may need to titrate further. Pt will have cardiology visit soon as well. - hydrALAZINE (APRESOLINE) 10 MG tablet; Take 1 tablet (10 mg total) by mouth 2 (two) times daily.  Dispense: 60 tablet; Refill: 2 - Ambulatory referral to Cardiology  2. Type 2 diabetes mellitus with stage 3b chronic kidney disease, without long-term current use of insulin (HCC) - POCT HgB A1C is 7.6 which is improving since 8.0 last check. Pt did not continue Farxiga , discussed Jardiance alternative or retrying farxiga . Discussed renal benefits of this medication as well - sitaGLIPtin  (JANUVIA ) 50 MG tablet; Take 1 tablet (50 mg total) by mouth daily.  Dispense: 90 tablet; Refill: 1  3. Paroxysmal A-fib [facility]) Will refer to cardiology for further evaluation and management - Ambulatory referral to Cardiology  4. Moderate tricuspid regurgitation Will refer to cardiology for further evaluation and management - Ambulatory referral to Cardiology  5. Moderate mitral regurgitation Will refer to cardiology for further evaluation and management - Ambulatory referral to Cardiology  6. Acute non-recurrent maxillary sinusitis Will start zpak - azithromycin  (ZITHROMAX ) 250 MG tablet; Take 2 tablets on day 1, then 1 tablet daily on days 2 through 5  Dispense: 6 tablet; Refill: 0  7. Skin lesion - Ambulatory referral to Dermatology  8. Itching - hydrOXYzine  (ATARAX ) 10 MG tablet; Take 1 tablet (10 mg total) by mouth 3 (three) times daily as needed.  Dispense: 90 tablet; Refill: 1   General Counseling: Tuwanda verbalizes  understanding of the findings of todays visit and agrees with plan of treatment. I have discussed any further diagnostic evaluation that may be needed or ordered today. We also  reviewed her medications today. she has been encouraged to call the office with any questions or concerns that should arise related to todays visit.    Orders Placed This Encounter  Procedures   Ambulatory referral to Cardiology   Ambulatory referral to Dermatology   POCT HgB A1C    Meds ordered this encounter  Medications   hydrOXYzine  (ATARAX ) 10 MG tablet    Sig: Take 1 tablet (10 mg total) by mouth 3 (three) times daily as needed.    Dispense:  90 tablet    Refill:  1   hydrALAZINE (APRESOLINE) 10 MG tablet    Sig: Take 1 tablet (10 mg total) by mouth 2 (two) times daily.    Dispense:  60 tablet    Refill:  2   sitaGLIPtin  (JANUVIA ) 50 MG tablet    Sig: Take 1 tablet (50 mg total) by mouth daily.    Dispense:  90 tablet    Refill:  1   azithromycin  (ZITHROMAX ) 250 MG tablet    Sig: Take 2 tablets on day 1, then 1 tablet daily on days 2 through 5    Dispense:  6 tablet    Refill:  0    This patient was seen by Tinnie Pro, PA-C in collaboration with Dr. Sigrid Bathe as a part of collaborative care agreement.   Total time spent:30 Minutes Time spent includes review of chart, medications, test results, and follow up plan with the patient.      Dr Fozia M Khan Internal medicine

## 2023-12-01 ENCOUNTER — Telehealth: Payer: Self-pay | Admitting: Physician Assistant

## 2023-12-02 ENCOUNTER — Telehealth: Payer: Self-pay | Admitting: Physician Assistant

## 2023-12-02 NOTE — Telephone Encounter (Signed)
 Awaiting 11/29/23 office notes for Dermatology referral-Tracey Love

## 2023-12-06 NOTE — Progress Notes (Unsigned)
  Cardiology Office Note   Date:  12/06/2023  ID:  Tracey Love, DOB 1940-03-05, MRN [id number] PCP: Tracey Tinnie POUR, PA-C  [facility] Providers Cardiologist:  None { Click to update primary MD,subspecialty MD or APP then REFRESH:1}    History of Present Illness Tracey Love is a 83 y.o. female PMH PAF on Eliquis , HLD, HTN, DM 2, CKD 3B who presents for further evaluation and management of atrial fibrillation.  ***.  Last LDL 42 07/2023  Relevant CVD History -TTE 03/2020 normal biventricular function, moderate to severe TR, moderate MR, moderate pulmonary hypertension   ROS: Pt denies any chest discomfort, jaw pain, arm pain, palpitations, syncope, presyncope, orthopnea, PND, or LE edema.  Studies Reviewed I have independently reviewed the patient's ECG, previous cardiac testing, recent medical records.  Physical Exam VS:  There were no vitals taken for this visit.       Wt Readings from Last 3 Encounters:  11/29/23 189 lb (85.7 kg)  09/27/23 189 lb 6.4 oz (85.9 kg)  08/16/23 192 lb (87.1 kg)    GEN: No acute distress. NECK: No JVD; No carotid bruits. CARDIAC: ***RRR, no murmurs, rubs, gallops. RESPIRATORY:  Clear to auscultation. EXTREMITIES:  Warm and well-perfused. No edema.  ASSESSMENT AND PLAN Atrial fibrillation, paroxysmal Moderate MR Moderate to severe TR Moderate pulmonary hypertension History of MGUS        {Are you ordering a CV Procedure (e.g. stress test, cath, DCCV, TEE, etc)?   Press F2        :[id number]}  Dispo: ***  Signed, Caron Poser, MD

## 2023-12-09 ENCOUNTER — Ambulatory Visit

## 2023-12-10 ENCOUNTER — Other Ambulatory Visit: Payer: Self-pay

## 2023-12-10 ENCOUNTER — Telehealth: Payer: Self-pay

## 2023-12-10 ENCOUNTER — Other Ambulatory Visit: Payer: Self-pay | Admitting: Physician Assistant

## 2023-12-10 MED ORDER — DOXYCYCLINE HYCLATE 100 MG PO TABS: 100.0000 mg | ORAL_TABLET | Freq: Two times a day (BID) | ORAL | 0 refills | Status: DC

## 2023-12-10 MED ORDER — DOXYCYCLINE HYCLATE 100 MG PO TABS: 100.0000 mg | ORAL_TABLET | Freq: Two times a day (BID) | ORAL | 0 refills | Status: AC

## 2023-12-10 NOTE — Telephone Encounter (Signed)
 Patient notified

## 2023-12-12 ENCOUNTER — Ambulatory Visit (INDEPENDENT_AMBULATORY_CARE_PROVIDER_SITE_OTHER): Admitting: Podiatry

## 2023-12-12 ENCOUNTER — Encounter: Payer: Self-pay | Admitting: Podiatry

## 2023-12-12 DIAGNOSIS — M79674 Pain in right toe(s): Secondary | ICD-10-CM | POA: Diagnosis not present

## 2023-12-12 DIAGNOSIS — B351 Tinea unguium: Secondary | ICD-10-CM | POA: Diagnosis not present

## 2023-12-12 DIAGNOSIS — M79675 Pain in left toe(s): Secondary | ICD-10-CM | POA: Diagnosis not present

## 2023-12-12 DIAGNOSIS — E119 Type 2 diabetes mellitus without complications: Secondary | ICD-10-CM | POA: Diagnosis not present

## 2023-12-12 NOTE — Progress Notes (Signed)
  Subjective:  Patient ID: Tracey Love, female    DOB: June 05, 1940,  MRN: [id number]  83 y.o. female presents to clinic with  preventative diabetic foot care for painful, discolored, thick toenails which interfere with daily activities  Chief Complaint  Patient presents with   Toe Pain    Diabetic foot care . A1c is 7.6 PA-C McDonough is her PCP. Last visit was 11/29/23     New problem(s): None   PCP is McDonough, Tinnie POUR, PA-C.  Allergies  Allergen Reactions   Gabapentin     Upset stomach   Farxiga  [Dapagliflozin ] Other (See Comments)    Pt reports she just couldn't function after taking medication   Penicillins     Doesn't remember reaction   Rivaroxaban Other (See Comments)    Patient stated she had a lot of blood gushing in her mouth when she brushed her teeth    Review of Systems: Negative except as noted in the HPI.   Objective:  Tracey Love is a pleasant 83 y.o. female in NAD. AAO x 3.  Vascular Examination: Vascular status intact b/l with palpable pedal pulses. Pedal hair sparse. CFT immediate b/l. No edema. No pain with calf compression b/l. Skin temperature gradient WNL b/l. No cyanosis or clubbing noted b/l LE.  Neurological Examination: Sensation grossly intact b/l with 10 gram monofilament. Vibratory sensation intact b/l.   Dermatological Examination: Pedal skin with normal turgor, texture and tone b/l. Toenails 1-5 b/l thick, discolored, elongated with subungual debris and pain on dorsal palpation. No hyperkeratotic lesions noted b/l.   Musculoskeletal Examination: Muscle strength 5/5 to b/l LE. Pes planus deformity noted bilateral LE. Patient ambulates independent of any assistive aids.  Radiographs: None  Last A1c:      Latest Ref Rng & Units 11/29/2023   10:39 AM 07/18/2023   11:02 AM 03/21/2023    9:40 AM 12/13/2022    9:47 AM  Hemoglobin A1C  Hemoglobin-A1c 4.0 - 5.6 % 7.6  8.0  8.0  7.1      Assessment:   1. Pain due to onychomycosis of  toenails of both feet   2. Type 2 diabetes mellitus without complication, without long-term current use of insulin (HCC)     Plan:  Patient was evaluated and treated. All patient's and/or POA's questions/concerns addressed on today's visit. Toenails 1-5 b/l debrided in length and girth without incident. Continue foot and shoe inspections daily. Monitor blood glucose per PCP/Endocrinologist's recommendations. Continue soft, supportive shoe gear daily. Report any pedal injuries to medical professional. Call office if there are any questions/concerns. -Patient/POA to call should there be question/concern in the interim.  Return in about 3 months (around 03/13/2024).  Delon LITTIE Merlin, DPM      [facility] LOCATION: [address], [place] [address]                   Office [phone]   [facility] LOCATION: [address] [place], [place] [address] Office [phone]

## 2023-12-16 DIAGNOSIS — I10 Essential (primary) hypertension: Secondary | ICD-10-CM | POA: Diagnosis not present

## 2023-12-16 DIAGNOSIS — D472 Monoclonal gammopathy: Secondary | ICD-10-CM | POA: Diagnosis not present

## 2023-12-16 DIAGNOSIS — E1165 Type 2 diabetes mellitus with hyperglycemia: Secondary | ICD-10-CM | POA: Diagnosis not present

## 2023-12-16 DIAGNOSIS — D7589 Other specified diseases of blood and blood-forming organs: Secondary | ICD-10-CM | POA: Diagnosis not present

## 2023-12-16 DIAGNOSIS — N1832 Chronic kidney disease, stage 3b: Secondary | ICD-10-CM | POA: Diagnosis not present

## 2023-12-16 DIAGNOSIS — E873 Alkalosis: Secondary | ICD-10-CM | POA: Diagnosis not present

## 2023-12-16 DIAGNOSIS — I4891 Unspecified atrial fibrillation: Secondary | ICD-10-CM | POA: Diagnosis not present

## 2023-12-17 ENCOUNTER — Telehealth: Payer: Self-pay | Admitting: Physician Assistant

## 2023-12-18 ENCOUNTER — Telehealth: Payer: Self-pay | Admitting: Physician Assistant

## 2023-12-18 NOTE — Telephone Encounter (Signed)
 Dermatology referral sent via [facility] to [facility]. Notified patient. Gave patient telephone # [phone]

## 2023-12-21 ENCOUNTER — Other Ambulatory Visit: Payer: Self-pay | Admitting: Physician Assistant

## 2023-12-21 DIAGNOSIS — L299 Pruritus, unspecified: Secondary | ICD-10-CM

## 2023-12-23 ENCOUNTER — Other Ambulatory Visit: Payer: Self-pay | Admitting: Physician Assistant

## 2023-12-23 DIAGNOSIS — N1831 Chronic kidney disease, stage 3a: Secondary | ICD-10-CM

## 2023-12-23 NOTE — Progress Notes (Unsigned)
  Cardiology Office Note   Date:  12/24/2023  ID:  Tracey Love, DOB Oct 31, 1940, MRN [id number] PCP: Kristina Tinnie Tracey Love  [facility] Providers Cardiologist:  Caron Poser, MD     History of Present Illness Tracey Love is a 83 y.o. female PMH PAF on Eliquis , HLD, HTN, DM 2, CKD 3B who presents for further evaluation and management of atrial fibrillation.  Patient reports she is overall feeling well.  She denies any significant LE edema, orthopnea, palpitations, chest discomfort, or any other symptoms.  Per chart review, she has been in persistent atrial fibrillation since at least 2020.  We discussed rate versus rhythm control strategies.  Since she is asymptomatic, she is not interested in pursuing any rhythm control procedures.  Last LDL 42 07/2023  Relevant CVD History -TTE 03/2020 normal biventricular function, moderate to severe TR, moderate MR, moderate pulmonary hypertension   ROS: Pt denies any chest discomfort, jaw pain, arm pain, palpitations, syncope, presyncope, orthopnea, PND, or LE edema.  Studies Reviewed I have independently reviewed the patient's ECG, previous cardiac testing, recent medical records.  Physical Exam VS:  BP (!) 153/88   Pulse 73   Ht 5' 4 (1.626 m)   Wt 192 lb 6.4 oz (87.3 kg)   SpO2 99%   BMI 33.03 kg/m        Wt Readings from Last 3 Encounters:  12/24/23 192 lb 6.4 oz (87.3 kg)  11/29/23 189 lb (85.7 kg)  09/27/23 189 lb 6.4 oz (85.9 kg)    GEN: No acute distress. NECK: No JVD; No carotid bruits. CARDIAC: RRR, no murmurs, rubs, gallops. RESPIRATORY:  Clear to auscultation. EXTREMITIES:  Warm and well-perfused. No edema.  ASSESSMENT AND PLAN Atrial fibrillation, permanent Patient presents for follow-up of atrial fibrillation.  She has been in A-fib since at least 2020 and is rate controlled without any AV nodal blockers likely signifying some underlying conduction disease.  She is fortunately asymptomatic.  We discussed  rate versus rhythm control strategies at length today.  She is not interested in any procedures or AADs which I think is reasonable given her age.  Given this, we will classify her has permanent atrial fibrillation.  Plan: - Continue Eliquis  5 mg twice daily - No AV nodal blockers needed since she is already rate controlled - As above, we will pursue rate control strategy only given patient wishes and lack of symptoms  Moderate MR Moderate to severe TR Moderate pulmonary hypertension And echo from 2022 showed moderate MR, moderate to severe TR, and moderate pulmonary hypertension.  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  She is doing well symptom wise and only needs Lasix  intermittently.  She looks euvolemic today.  Plan: - Repeat echocardiogram to monitor valvular disease - Continue Lasix  20 mg every 3 days; this can be uptitrated as needed for dyspnea        Dispo: RTC 1 year or sooner as needed  Signed, Caron Poser, MD

## 2023-12-24 ENCOUNTER — Other Ambulatory Visit: Payer: Self-pay

## 2023-12-24 ENCOUNTER — Ambulatory Visit

## 2023-12-24 VITALS — BP 153/88 | HR 73 | Ht 64.0 in | Wt 192.4 lb

## 2023-12-24 DIAGNOSIS — I071 Rheumatic tricuspid insufficiency: Secondary | ICD-10-CM

## 2023-12-24 DIAGNOSIS — I272 Pulmonary hypertension, unspecified: Secondary | ICD-10-CM

## 2023-12-24 DIAGNOSIS — I340 Nonrheumatic mitral (valve) insufficiency: Secondary | ICD-10-CM | POA: Diagnosis not present

## 2023-12-24 DIAGNOSIS — I4821 Permanent atrial fibrillation: Secondary | ICD-10-CM | POA: Diagnosis not present

## 2023-12-24 MED ORDER — ACCU-CHEK GUIDE TEST VI STRP: 1.0000 | ORAL_STRIP | Freq: Every day | 1 refills | Status: AC

## 2023-12-24 MED ORDER — ACCU-CHEK SOFTCLIX LANCETS MISC: 1 refills | Status: AC

## 2023-12-24 MED ORDER — ACCU-CHEK GUIDE ME W/DEVICE KIT: PACK | 0 refills | Status: DC

## 2023-12-24 NOTE — Patient Instructions (Signed)
 Medication Instructions:  Your physician recommends that you continue on your current medications as directed. Please refer to the Current Medication list given to you today.  *If you need a refill on your cardiac medications before your next appointment, please call your pharmacy*  Lab Work: No labs ordered today  If you have labs (blood work) drawn today and your tests are completely normal, you will receive your results only by: MyChart Message (if you have MyChart) OR A paper copy in the mail If you have any lab test that is abnormal or we need to change your treatment, we will call you to review the results.  Testing/Procedures: Your physician has requested that you have an echocardiogram. Echocardiography is a painless test that uses sound waves to create images of your heart. It provides your doctor with information about the size and shape of your heart and how well your heart's chambers and valves are working.   You may receive an ultrasound enhancing agent through an IV if needed to better visualize your heart during the echo. This procedure takes approximately one hour.  There are no restrictions for this procedure.  This will take place at [address] [facility] [address] [facility]) [address], [place] [address]  Please note: We ask at that you not bring children with you during ultrasound (echo/ vascular) testing. Due to room size and safety concerns, children are not allowed in the ultrasound rooms during exams. Our front office staff cannot provide observation of children in our lobby area while testing is being conducted. An adult accompanying a patient to their appointment will only be allowed in the ultrasound room at the discretion of the ultrasound technician under special circumstances. We apologize for any inconvenience.   Follow-Up: At [facility], you and your health needs are our priority.  As part of our continuing mission to provide you with exceptional heart  care, our providers are all part of one team.  This team includes your primary Cardiologist (physician) and Advanced Practice Providers or APPs (Physician Assistants and Nurse Practitioners) who all work together to provide you with the care you need, when you need it.  Your next appointment:   12 month(s)  Provider:   Caron Poser, MD   We recommend signing up for the patient portal called MyChart.  Sign up information is provided on this After Visit Summary.  MyChart is used to connect with patients for Virtual Visits (Telemedicine).  Patients are able to view lab/test results, encounter notes, upcoming appointments, etc.  Non-urgent messages can be sent to your provider as well.   To learn more about what you can do with MyChart, go to [email or web address].

## 2023-12-24 NOTE — Telephone Encounter (Signed)
 Please send accu check supplies instead then

## 2023-12-27 ENCOUNTER — Ambulatory Visit (INDEPENDENT_AMBULATORY_CARE_PROVIDER_SITE_OTHER): Admitting: Physician Assistant

## 2023-12-27 ENCOUNTER — Encounter: Payer: Self-pay | Admitting: Physician Assistant

## 2023-12-27 VITALS — BP 134/78 | HR 74 | Temp 98.0°F | Resp 16 | Ht 64.0 in | Wt 191.0 lb

## 2023-12-27 DIAGNOSIS — D472 Monoclonal gammopathy: Secondary | ICD-10-CM

## 2023-12-27 DIAGNOSIS — M064 Inflammatory polyarthropathy: Secondary | ICD-10-CM

## 2023-12-27 DIAGNOSIS — E1122 Type 2 diabetes mellitus with diabetic chronic kidney disease: Secondary | ICD-10-CM | POA: Diagnosis not present

## 2023-12-27 DIAGNOSIS — N1831 Chronic kidney disease, stage 3a: Secondary | ICD-10-CM

## 2023-12-27 DIAGNOSIS — I10 Essential (primary) hypertension: Secondary | ICD-10-CM | POA: Diagnosis not present

## 2023-12-27 DIAGNOSIS — D696 Thrombocytopenia, unspecified: Secondary | ICD-10-CM

## 2023-12-27 DIAGNOSIS — R7989 Other specified abnormal findings of blood chemistry: Secondary | ICD-10-CM | POA: Diagnosis not present

## 2023-12-27 MED ORDER — SITAGLIPTIN PHOSPHATE 25 MG PO TABS: 25.0000 mg | ORAL_TABLET | Freq: Every day | ORAL | 1 refills | Status: AC

## 2023-12-27 NOTE — Progress Notes (Signed)
 [facility] [address] [place], [place] [address]  Internal MEDICINE  Office Visit Note  Patient Name: Tracey Love  [id number]  [id number]  Date of Service: 12/27/2023  Chief Complaint  Patient presents with   Follow-up   Hypertension   Diabetes    HPI Pt is here for routine follow up -Followed by cardiology, has upcoming test in Dec -goes back to nephrology in Jan, states she was told she needs hematology again but that she needs referral from me for this. 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 -BP at home 134/78 this morning, BP has been doing better. 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 Will go back to ortho for knee injections, but would like to see rheumatology. Discussed we can try for referral but it may not be accepted. Unable to take NSAIDs due to kidneys, but now limiting tylenol  due to liver enzymes. Pt does not want any narcotics. Discussed cymbalta but doesn't want this either. -she is taking Farxiga  now and doing well despite initially stating S/E; needs her 25mg  januvia  sent (takes with 50mg  for total of 75mg )  Current Medication: Outpatient Encounter Medications as of 12/27/2023  Medication Sig   Accu-Chek Softclix Lancets lancets Use as instructed once a daily Dx E11.65   acetaminophen  (TYLENOL ) 500 MG tablet Take 1,000 mg by mouth every 8 (eight) hours as needed for mild pain or moderate pain.    atorvastatin  (LIPITOR) 40 MG tablet TAKE 1 TABLET BY MOUTH EVERY DAY   Blood Glucose Monitoring Suppl (ACCU-CHEK GUIDE ME) w/Device KIT Use as directed once daily DX E11.65   Cholecalciferol (VITAMIN D -3) 1000 UNITS CAPS Take 1,000 Units by mouth daily.   CRANBERRY PO Take 4,200 mg  by mouth daily as needed.   doxycycline (VIBRA-TABS) 100 MG tablet Take 1 tablet (100 mg total) by mouth 2 (two) times daily.   ELIQUIS  5 MG TABS tablet TAKE 1 TABLET BY MOUTH TWICE A DAY   furosemide  (LASIX ) 20 MG tablet TAKE 1 TABLET BY MOUTH EVERY DAY FOR 3 DAYS FOR SWELLING, THEN AS NEEDED   glimepiride  (AMARYL ) 1 MG tablet TAKE 1 TABLET BY MOUTH DAILY WITH BREAKFAST.   glucose blood (ACCU-CHEK GUIDE TEST) test strip 1 each by Other route daily at 12 noon. DX E11.65   hydrALAZINE (APRESOLINE) 10 MG tablet Take 1 tablet (10 mg total) by mouth 2 (two) times daily.   hydrOXYzine  (ATARAX ) 10 MG tablet TAKE 1 TABLET BY MOUTH THREE TIMES A DAY AS NEEDED   losartan  (COZAAR ) 25 MG tablet TAKE 1 TABLET (25 MG TOTAL) BY MOUTH DAILY.   oxybutynin  (DITROPAN -XL) 5 MG 24 hr tablet TAKE 1 TABLET BY MOUTH EVERYDAY AT BEDTIME (Patient taking differently: Take 5 mg by mouth at bedtime.)   sitaGLIPtin  (JANUVIA ) 25 MG tablet Take 1 tablet (25 mg total) by mouth daily. To be taken with 50mg  tablet for a total of 75mg  daily.   sitaGLIPtin  (JANUVIA ) 50 MG tablet Take 1 tablet (50 mg total) by mouth daily.   TURMERIC PO Take 500 mg by mouth daily.   vitamin B-12 (CYANOCOBALAMIN ) 500 MCG tablet Take 500 mcg by mouth daily.   vitamin E 100 UNIT capsule Take  100 Units by mouth daily.   No facility-administered encounter medications on file as of 12/27/2023.    Surgical History: Past Surgical History:  Procedure Laterality Date   CATARACT EXTRACTION W/PHACO Right 10/21/2018   Procedure: CATARACT EXTRACTION PHACO AND INTRAOCULAR LENS PLACEMENT (IOC) RIGHT VISION BLUE DIABETIC  08.27.0  24.8%  126.0;  Surgeon: Jaye Fallow, MD;  Location: [facility];  Service: Ophthalmology;  Laterality: Right;  diabetic - oral meds   CATARACT EXTRACTION W/PHACO Left 11/18/2018   Procedure: CATARACT EXTRACTION PHACO AND INTRAOCULAR LENS PLACEMENT (IOC) LEFT DIABETIC 01:18.5     15.8%    12.45;  Surgeon: Jaye Fallow,  MD;  Location: [facility];  Service: Ophthalmology;  Laterality: Left;  DIABETIC   TUBAL LIGATION  1966    Medical History: Past Medical History:  Diagnosis Date   A-fib (HCC)    Diabetes mellitus, type 2 (HCC)    Gout    feet   Hypertension    MGUS (monoclonal gammopathy of unknown significance)    Osteoporosis    Thyroid  nodule     Family History: Family History  Problem Relation Age of Onset   Asthma Sister    Breast cancer Neg Hx     Social History   Socioeconomic History   Marital status: Divorced    Spouse name: Not on file   Number of children: Not on file   Years of education: Not on file   Highest education level: Not on file  Occupational History   Not on file  Tobacco Use   Smoking status: Never   Smokeless tobacco: Never  Vaping Use   Vaping status: Never Used  Substance and Sexual Activity   Alcohol use: Never   Drug use: Never   Sexual activity: Not on file  Other Topics Concern   Not on file  Social History Narrative   Not on file   Social Drivers of Health   Financial Resource Strain: Medium Risk (03/27/2023)   Received from [facility]   Overall Financial Resource Strain (CARDIA)    Difficulty of Paying Living Expenses: Somewhat hard  Food Insecurity: Food Insecurity Present (03/27/2023)   Received from [facility]   Hunger Vital Sign    Within the past 12 months, you worried that your food would run out before you got the money to buy more.: Never true    Within the past 12 months, the food you bought just didn't last and you didn't have money to get more.: Sometimes true  Transportation Needs: No Transportation Needs (03/27/2023)   Received from [facility] - Transportation    In the past 12 months, has lack of transportation kept you from medical appointments or from getting medications?: No    Lack of Transportation (Non-Medical): No  Physical Activity: Not on file   Stress: Not on file  Social Connections: Not on file  Intimate Partner Violence: Not on file      Review of Systems  Constitutional:  Negative for chills, fatigue and unexpected weight change.  HENT:  Negative for rhinorrhea, sneezing and sore throat.   Eyes:  Negative for redness.  Respiratory:  Negative for cough, chest tightness, shortness of breath and wheezing.   Cardiovascular:  Negative for chest pain and palpitations.  Gastrointestinal:  Negative for abdominal pain, constipation, diarrhea, nausea and vomiting.  Genitourinary:  Negative for dysuria and frequency.  Musculoskeletal:  Positive for arthralgias. Negative for back pain,  joint swelling and neck pain.  Skin:  Negative for rash.  Neurological: Negative.  Negative for dizziness, tremors, light-headedness and numbness.  Hematological:  Negative for adenopathy. Does not bruise/bleed easily.  Psychiatric/Behavioral:  Negative for behavioral problems (Depression), sleep disturbance and suicidal ideas. The patient is not nervous/anxious.     Vital Signs: BP (!) 162/97   Pulse 74   Temp 98 F (36.7 C)   Resp 16   Ht 5' 4 (1.626 m)   Wt 191 lb (86.6 kg)   SpO2 99%   BMI 32.79 kg/m    Physical Exam Vitals and nursing note reviewed.  HENT:     Head: Normocephalic and atraumatic.  Eyes:     Extraocular Movements: Extraocular movements intact.  Cardiovascular:     Rate and Rhythm: Normal rate and regular rhythm.  Pulmonary:     Effort: Pulmonary effort is normal.     Breath sounds: Normal breath sounds.  Skin:    General: Skin is warm and dry.  Neurological:     General: No focal deficit present.  Psychiatric:        Mood and Affect: Mood normal.        Behavior: Behavior normal.        Assessment/Plan: 1. Type 2 diabetes mellitus with stage 3a chronic kidney disease, without long-term current use of insulin (HCC) (Primary) Continue current medications and will order US  again. Following with  nephrology - US  Abdomen Complete; Future - sitaGLIPtin  (JANUVIA ) 25 MG tablet; Take 1 tablet (25 mg total) by mouth daily. To be taken with 50mg  tablet for a total of 75mg  daily.  Dispense: 90 tablet; Refill: 1  2. Essential hypertension Elevated in office, but better controlled at home. May consider raising losartan  in future now that renal function somewhat improved. Monitor closely  3. Inflammatory polyarthropathy of multiple sites [facility]) - Ambulatory referral to Rheumatology  4. Elevated LFTs - US  Abdomen Complete; Future - Ambulatory referral to Hematology / Oncology  5. MGUS (monoclonal gammopathy of unknown significance) Will refer back to hematology now that patient showing impact to organs and worsening labs - Ambulatory referral to Hematology / Oncology  6. Low platelet count - Ambulatory referral to Hematology / Oncology   General Counseling: yannely kintzel understanding of the findings of todays visit and agrees with plan of treatment. I have discussed any further diagnostic evaluation that may be needed or ordered today. We also reviewed her medications today. she has been encouraged to call the office with any questions or concerns that should arise related to todays visit.    No orders of the defined types were placed in this encounter.   No orders of the defined types were placed in this encounter.   This patient was seen by Tinnie Pro, PA-C in collaboration with Dr. Sigrid Bathe as a part of collaborative care agreement.   Total time spent:35 Minutes Time spent includes review of chart, medications, test results, and follow up plan with the patient.      Dr Fozia M Khan Internal medicine

## 2023-12-31 ENCOUNTER — Telehealth: Payer: Self-pay | Admitting: Physician Assistant

## 2023-12-31 NOTE — Telephone Encounter (Signed)
 Rheumatology referral sent via [facility]. Notified patient. Gave patient telephone # [phone]

## 2024-01-01 ENCOUNTER — Telehealth: Payer: Self-pay | Admitting: Physician Assistant

## 2024-01-01 NOTE — Telephone Encounter (Signed)
 Dermatology appointment 12/31/2023 with [facility]

## 2024-01-07 ENCOUNTER — Ambulatory Visit

## 2024-01-14 ENCOUNTER — Telehealth: Payer: Self-pay

## 2024-01-14 NOTE — Progress Notes (Signed)
   01/14/2024  Patient ID: Tracey Love, female   DOB: Jun 27, 1940, 83 y.o.   MRN: [id number]  Pharmacy Quality Measure Review  This patient is appearing on a report for being at risk of failing the adherence measure for cholesterol (statin) medications this calendar year.   Medication: Atorvastatin  Last fill date: 09/04/23 for 90 day supply  Spoke with patient. She reports she still has plenty of medication on hand and does not need to fill at this time. Confirms she is taking a full tablet daily, denies any missed doses. Made patient aware a refill is available at the pharmacy whenever she is ready to request.  Jon VEAR Lindau, PharmD Clinical Pharmacist [phone]

## 2024-01-20 ENCOUNTER — Inpatient Hospital Stay: Admitting: Nurse Practitioner

## 2024-01-23 ENCOUNTER — Inpatient Hospital Stay

## 2024-01-23 ENCOUNTER — Inpatient Hospital Stay: Attending: Nurse Practitioner | Admitting: Nurse Practitioner

## 2024-01-23 ENCOUNTER — Encounter: Payer: Self-pay | Admitting: Nurse Practitioner

## 2024-01-23 VITALS — BP 166/103 | HR 86 | Temp 97.4°F | Ht 64.0 in | Wt 188.0 lb

## 2024-01-23 DIAGNOSIS — I10 Essential (primary) hypertension: Secondary | ICD-10-CM

## 2024-01-23 DIAGNOSIS — N1831 Chronic kidney disease, stage 3a: Secondary | ICD-10-CM | POA: Insufficient documentation

## 2024-01-23 DIAGNOSIS — E1122 Type 2 diabetes mellitus with diabetic chronic kidney disease: Secondary | ICD-10-CM | POA: Insufficient documentation

## 2024-01-23 DIAGNOSIS — D472 Monoclonal gammopathy: Secondary | ICD-10-CM | POA: Insufficient documentation

## 2024-01-23 DIAGNOSIS — D7589 Other specified diseases of blood and blood-forming organs: Secondary | ICD-10-CM | POA: Insufficient documentation

## 2024-01-23 LAB — CMP (CANCER CENTER ONLY)
ALT: 40 U/L (ref 0–44)
AST: 65 U/L — ABNORMAL HIGH (ref 15–41)
Albumin: 4.3 g/dL (ref 3.5–5.0)
Alkaline Phosphatase: 456 U/L — ABNORMAL HIGH (ref 38–126)
Anion gap: 13 (ref 5–15)
BUN: 12 mg/dL (ref 8–23)
CO2: 25 mmol/L (ref 22–32)
Calcium: 10.4 mg/dL — ABNORMAL HIGH (ref 8.9–10.3)
Chloride: 102 mmol/L (ref 98–111)
Creatinine: 1.17 mg/dL — ABNORMAL HIGH (ref 0.44–1.00)
GFR, Estimated: 46 mL/min — ABNORMAL LOW (ref >60)
Glucose, Bld: 147 mg/dL — ABNORMAL HIGH (ref 70–99)
Potassium: 4.2 mmol/L (ref 3.5–5.1)
Sodium: 140 mmol/L (ref 135–145)
Total Bilirubin: 1.5 mg/dL — ABNORMAL HIGH (ref 0.0–1.2)
Total Protein: 8.7 g/dL — ABNORMAL HIGH (ref 6.5–8.1)

## 2024-01-23 LAB — CBC WITH DIFFERENTIAL (CANCER CENTER ONLY)
Abs Immature Granulocytes: 0.01 K/uL (ref 0.00–0.07)
Basophils Absolute: 0 K/uL (ref 0.0–0.1)
Basophils Relative: 1 %
Eosinophils Absolute: 0.1 K/uL (ref 0.0–0.5)
Eosinophils Relative: 1 %
HCT: 34.3 % — ABNORMAL LOW (ref 36.0–46.0)
Hemoglobin: 11.5 g/dL — ABNORMAL LOW (ref 12.0–15.0)
Immature Granulocytes: 0 %
Lymphocytes Relative: 31 %
Lymphs Abs: 1.5 K/uL (ref 0.7–4.0)
MCH: 35.6 pg — ABNORMAL HIGH (ref 26.0–34.0)
MCHC: 33.5 g/dL (ref 30.0–36.0)
MCV: 106.2 fL — ABNORMAL HIGH (ref 80.0–100.0)
Monocytes Absolute: 0.6 K/uL (ref 0.1–1.0)
Monocytes Relative: 11 %
Neutro Abs: 2.8 K/uL (ref 1.7–7.7)
Neutrophils Relative %: 56 %
Platelet Count: 186 K/uL (ref 150–400)
RBC: 3.23 MIL/uL — ABNORMAL LOW (ref 3.87–5.11)
RDW: 16.2 % — ABNORMAL HIGH (ref 11.5–15.5)
WBC Count: 5 K/uL (ref 4.0–10.5)
nRBC: 0 % (ref 0.0–0.2)

## 2024-01-23 MED ORDER — ONETOUCH DELICA PLUS LANCET33G MISC: 12 refills | Status: AC

## 2024-01-23 MED ORDER — ONETOUCH ULTRA BLUE TEST VI STRP: ORAL_STRIP | 12 refills | Status: AC

## 2024-01-23 NOTE — Progress Notes (Signed)
 [facility]  [phone] [phone]  ID: Tracey Love OB: [birth year]  MR#: [id number]  RDW#:[id number]  Patient Care Team: Kristina Tinnie MARLA DEVONNA as PCP - General (Physician Assistant) Argentina Clap, MD as PCP - Cardiology (Cardiology)  CHIEF COMPLAINT: Macrocytosis, MGUS.  INTERVAL HISTORY: Patient here today to reestablish care.  Patient was sent from her PCP due to worsening lab work.  She does have a history of MGUS.  She was followed in our clinic but released a back in 2022 for stability.  Today she presents to clinic independently.  Her blood pressure was elevated during visit and she reported that she had not taken her blood pressure medicine yet today.  She has also not been checking her blood sugars due to not having test strips for her glucometer.  I am not sure how compliant she is with her medications which could also be a contributing factor to worsening renal function and increased liver enzymes.  She is going for an abdominal ultrasound this week due to elevated liver panel.  She denies any bone pain.  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  She denies any dizziness, increased fatigue, worsening pain, fever or chills.  I discussed plan of care with Dr. Jacobo as he had previously seen the patient in 2022.  Patient in agreement with plan today to go ahead and repeat her labs.  These labs including a CBC, CMP, SPEP, light chains, IgG.  She will return to the clinic in 1 month to go over her blood work.  She does not appear to have any anemia in the past.  However if her blood work results with any type of anemia we can check iron panel, ferritin, folate, and vitamin B12 level on next visit to see if this is is a contributing factor.  REVIEW OF SYSTEMS:   Review of Systems  Constitutional: Negative.  Negative for fever, malaise/fatigue and weight loss.  Respiratory: Negative.  Negative for cough, hemoptysis and shortness of  breath.   Cardiovascular: Negative.  Negative for chest pain and leg swelling.  Gastrointestinal: Negative.  Negative for abdominal pain.  Genitourinary: Negative.  Negative for dysuria.  Musculoskeletal: Negative.  Negative for back pain.  Skin: Negative.  Negative for rash.  Neurological: Negative.  Negative for dizziness, focal weakness, weakness and headaches.  Psychiatric/Behavioral: Negative.  The patient is not nervous/anxious.     As per HPI. Otherwise, a complete review of systems is negative.  PAST MEDICAL HISTORY: Past Medical History:  Diagnosis Date   A-fib (HCC)    Diabetes mellitus, type 2 (HCC)    Gout    feet   Hypertension    MGUS (monoclonal gammopathy of unknown significance)    Osteoporosis    Thyroid  nodule     PAST SURGICAL HISTORY: Past Surgical History:  Procedure Laterality Date   CATARACT EXTRACTION W/PHACO Right 10/21/2018   Procedure: CATARACT EXTRACTION PHACO AND INTRAOCULAR LENS PLACEMENT (IOC) RIGHT VISION BLUE DIABETIC  08.27.0  24.8%  126.0;  Surgeon: Jaye Fallow, MD;  Location: [facility];  Service: Ophthalmology;  Laterality: Right;  diabetic - oral meds   CATARACT EXTRACTION W/PHACO Left 11/18/2018   Procedure: CATARACT EXTRACTION PHACO AND INTRAOCULAR LENS PLACEMENT (IOC) LEFT DIABETIC 01:18.5     15.8%    12.45;  Surgeon: Jaye Fallow, MD;  Location: [facility];  Service: Ophthalmology;  Laterality: Left;  DIABETIC   TUBAL LIGATION  1966    FAMILY HISTORY:  Family History  Problem Relation Age of Onset   Asthma Sister    Breast cancer Neg Hx     ADVANCED DIRECTIVES (Y/N):  N  HEALTH MAINTENANCE: Social History   Tobacco Use   Smoking status: Never   Smokeless tobacco: Never  Vaping Use   Vaping status: Never Used  Substance Use Topics   Alcohol use: Never   Drug use: Never     Colonoscopy:  PAP:  Bone density:  Lipid panel:  Allergies  Allergen Reactions   Gabapentin Nausea And Vomiting and  Nausea Only    Upset stomach  Upset stomach  Upset stomach  Upset stomach    Upset stomach Upset stomach   Penicillins     Doesn't remember reaction   Rivaroxaban Other (See Comments)    Patient stated she had a lot of blood gushing in her mouth when she brushed her teeth    Current Outpatient Medications  Medication Sig Dispense Refill   Accu-Chek Softclix Lancets lancets Use as instructed once a daily Dx E11.65 100 each 1   acetaminophen  (TYLENOL ) 500 MG tablet Take 1,000 mg by mouth every 8 (eight) hours as needed for mild pain or moderate pain.      atorvastatin  (LIPITOR) 40 MG tablet TAKE 1 TABLET BY MOUTH EVERY DAY 90 tablet 1   Blood Glucose Monitoring Suppl (ACCU-CHEK GUIDE ME) w/Device KIT Use as directed once daily DX E11.65 1 kit 0   Cholecalciferol (VITAMIN D -3) 1000 UNITS CAPS Take 1,000 Units by mouth daily.     CRANBERRY PO Take 4,200 mg by mouth daily as needed.     dapagliflozin  propanediol (FARXIGA ) 5 MG TABS tablet Take by mouth daily.     doxycycline  (VIBRA -TABS) 100 MG tablet Take 1 tablet (100 mg total) by mouth 2 (two) times daily. 14 tablet 0   ELIQUIS  5 MG TABS tablet TAKE 1 TABLET BY MOUTH TWICE A DAY 60 tablet 3   furosemide  (LASIX ) 20 MG tablet TAKE 1 TABLET BY MOUTH EVERY DAY FOR 3 DAYS FOR SWELLING, THEN AS NEEDED 90 tablet 1   glimepiride  (AMARYL ) 1 MG tablet TAKE 1 TABLET BY MOUTH DAILY WITH BREAKFAST. 90 tablet 1   glucose blood (ACCU-CHEK GUIDE TEST) test strip 1 each by Other route daily at 12 noon. DX E11.65 100 each 1   glucose blood (ONETOUCH ULTRA BLUE TEST) test strip Use as instructed 100 each 12   hydrALAZINE  (APRESOLINE ) 10 MG tablet Take 1 tablet (10 mg total) by mouth 2 (two) times daily. 60 tablet 2   hydrOXYzine  (ATARAX ) 10 MG tablet TAKE 1 TABLET BY MOUTH THREE TIMES A DAY AS NEEDED 270 tablet 1   Lancets (ONETOUCH DELICA PLUS LANCET33G) MISC Check blood sugar twice daily. 100 each 12   losartan  (COZAAR ) 25 MG tablet TAKE 1 TABLET (25  MG TOTAL) BY MOUTH DAILY. 90 tablet 1   oxybutynin  (DITROPAN -XL) 5 MG 24 hr tablet TAKE 1 TABLET BY MOUTH EVERYDAY AT BEDTIME (Patient taking differently: Take 5 mg by mouth at bedtime.) 90 tablet 1   sitaGLIPtin  (JANUVIA ) 25 MG tablet Take 1 tablet (25 mg total) by mouth daily. To be taken with 50mg  tablet for a total of 75mg  daily. 90 tablet 1   sitaGLIPtin  (JANUVIA ) 50 MG tablet Take 1 tablet (50 mg total) by mouth daily. 90 tablet 1   TURMERIC PO Take 500 mg by mouth daily.     vitamin B-12 (CYANOCOBALAMIN ) 500 MCG tablet Take 500 mcg by mouth daily.  vitamin E 100 UNIT capsule Take 100 Units by mouth daily.     No current facility-administered medications for this visit.    OBJECTIVE: Vitals:   01/23/24 0843 01/23/24 0922  BP: (!) 182/81 (!) 166/103  Pulse: 86   Temp: (!) 97.4 F (36.3 C)   SpO2: 100%      Body mass index is 32.27 kg/m.    ECOG FS:0 - Asymptomatic  Component Ref Range & Units 1 mo ago  WBC 3.6 - 11.2 10*9/L 6.1  RBC 3.95 - 5.13 10*12/L 3.39 Low   HGB 11.3 - 14.9 g/dL 87.6  HCT 65.9 - 55.9 % 36.0  MCV 77.6 - 95.7 fL 106.2 High   MCH 25.9 - 32.4 pg 36.2 High   MCHC 32.0 - 36.0 g/dL 65.8  RDW 87.7 - 84.7 % 14.4  MPV 6.8 - 10.7 fL 11.6 High   Platelet 150 - 450 10*9/L 137 Low   Neutrophils % % 54.2  Lymphocytes % % 33.6  Monocytes % % 10.3  Eosinophils % % 1.0  Basophils % % 0.9  Absolute Neutrophils 1.8 - 7.8 10*9/L 3.3  Absolute Lymphocytes 1.1 - 3.6 10*9/L 2.0  Absolute Monocytes 0.3 - 0.8 10*9/L 0.6  Absolute Eosinophils 0.0 - 0.5 10*9/L 0.1  Absolute Basophils 0.0 - 0.1 10*9/L 0.1  Macrocytosis Not Present Slight   Component Ref Range & Units 1 mo ago  Sodium 135 - 145 mmol/L 144  Potassium 3.4 - 4.8 mmol/L 4.5  Chloride 98 - 107 mmol/L 100  CO2 20.0 - 31.0 mmol/L 32.0 High   Anion Gap 5 - 14 mmol/L 12  BUN 9 - 23 mg/dL 13  Creatinine 9.44 - 8.97 mg/dL 8.99  BUN/Creatinine Ratio 13  eGFR CKD-EPI (2021)  Female >=60 mL/min/1.51m2 56 Low   Comment: eGFR calculated with CKD-EPI 2021 equation in accordance with [employer] and [employer] of Nephrology Task Force recommendations.  Glucose 70 - 179 mg/dL 899  Calcium  8.7 - 10.4 mg/dL 88.7 High   Albumin 3.4 - 5.0 g/dL 4.2  Total Protein 5.7 - 8.2 g/dL 8.6 High   Total Bilirubin 0.3 - 1.2 mg/dL 0.7  AST <=65 U/L 63 High   ALT 10 - 49 U/L 79 High   Alkaline Phosphatase 46 - 116 U/L 248   Component Ref Range & Units 1 mo ago  PTH 18.4 - 80.1 pg/mL 30.6   General: Well-developed, well-nourished, no acute distress. Eyes: Pink conjunctiva, anicteric sclera. HEENT: Normocephalic, moist mucous membranes. Lungs: No audible wheezing or coughing. Heart: Regular rate and rhythm. Abdomen: Soft, nontender, no obvious distention. Musculoskeletal: No edema, cyanosis, or clubbing. Neuro: Alert, answering all questions appropriately. Cranial nerves grossly intact. Skin: No rashes or petechiae noted. Psych: Normal affect.    STUDIES: No results found.  ASSESSMENT: Macrocytosis, MGUS.  PLAN:   MGUS:  History of MGUS.  Patient's M spike and immunoglobulins are elevated, but stable and she was released from practice in 2022.  Currently her M spike is 0.5 and was noted to be 0.4 in September 2016.  IgG level is 1739, which is actually decreased over 3 years ago.  She has no evidence of endorgan damage.  No intervention is needed at this time.  Patient does not require a bone marrow biopsy.  Given the chronicity, risk of progression to myeloma is minimal.  PCP concerned due to worsening renal function and increased liver enzymes.  Ordered spep, cbc, cmp, light chains, IgG panel today f/u to discuss  results in 1 month. Hypercalemia: Looks like she has a long history of mild hypercalcemia with normal PTH level of 30.6.  Could be secondary to kidney disease.  Repeat CMP, SPEP, and light chains today.    Diabetes with stage 3a ckd:  defer management to PCP currently taking januvia , glimepiride , and farxiga .  She has not been monitoring blood sugars.  She has one glucometer with her today that does not work, another that is out of test strips.  Nursing staff to send order to pharmacy for testing strips for working glucometer.  Encouraged her to pick them up and start monitoring blood sugars in AM before eating and PM before bed and to record for PCP to review.  Ordered repeat cmp today last know creatinine at PCP office was 1.00 Hypertension: elevated BP in office today patient reports not taking medication today.  Encouraged the importance of compliance.  Informed her that poorly controlled blood pressure and sugar could directly impact renal function and liver function.  She verbalizes understanding.  Defer management of HTN to pcp.   Follow up plan: F/U 1 month see MD to review results of MGUS labs drawn today LP     Patient expressed understanding and was in agreement with this plan. She also understands that She can call clinic at any time with any questions, concerns, or complaints.    Morna Husband, NP   01/23/2024 1:12 PM

## 2024-01-24 ENCOUNTER — Ambulatory Visit: Admitting: Physician Assistant

## 2024-01-24 LAB — IGG, IGA, IGM
IgA: 293 mg/dL (ref 64–422)
IgG (Immunoglobin G), Serum: 2202 mg/dL — ABNORMAL HIGH (ref 586–1602)
IgM (Immunoglobulin M), Srm: 40 mg/dL (ref 26–217)

## 2024-01-24 LAB — KAPPA/LAMBDA LIGHT CHAINS
Kappa free light chain: 32.4 mg/L — ABNORMAL HIGH (ref 3.3–19.4)
Kappa, lambda light chain ratio: 0.66 (ref 0.26–1.65)
Lambda free light chains: 49 mg/L — ABNORMAL HIGH (ref 5.7–26.3)

## 2024-01-26 LAB — PROTEIN ELECTROPHORESIS, SERUM
A/G Ratio: 0.8 (ref 0.7–1.7)
Albumin ELP: 3.5 g/dL (ref 2.9–4.4)
Alpha-1-Globulin: 0.4 g/dL (ref 0.0–0.4)
Alpha-2-Globulin: 0.7 g/dL (ref 0.4–1.0)
Beta Globulin: 1.1 g/dL (ref 0.7–1.3)
Gamma Globulin: 2.3 g/dL — ABNORMAL HIGH (ref 0.4–1.8)
Globulin, Total: 4.5 g/dL — ABNORMAL HIGH (ref 2.2–3.9)
M-Spike, %: 0.9 g/dL — ABNORMAL HIGH
Total Protein ELP: 8 g/dL (ref 6.0–8.5)

## 2024-01-28 ENCOUNTER — Ambulatory Visit

## 2024-01-30 ENCOUNTER — Ambulatory Visit (INDEPENDENT_AMBULATORY_CARE_PROVIDER_SITE_OTHER): Admitting: Physician Assistant

## 2024-01-30 ENCOUNTER — Encounter: Payer: Self-pay | Admitting: Physician Assistant

## 2024-01-30 ENCOUNTER — Other Ambulatory Visit: Payer: Self-pay

## 2024-01-30 DIAGNOSIS — R7989 Other specified abnormal findings of blood chemistry: Secondary | ICD-10-CM

## 2024-01-30 DIAGNOSIS — I480 Paroxysmal atrial fibrillation: Secondary | ICD-10-CM | POA: Diagnosis not present

## 2024-01-30 DIAGNOSIS — E1122 Type 2 diabetes mellitus with diabetic chronic kidney disease: Secondary | ICD-10-CM | POA: Diagnosis not present

## 2024-01-30 DIAGNOSIS — L299 Pruritus, unspecified: Secondary | ICD-10-CM

## 2024-01-30 DIAGNOSIS — N1831 Chronic kidney disease, stage 3a: Secondary | ICD-10-CM | POA: Diagnosis not present

## 2024-01-30 DIAGNOSIS — M064 Inflammatory polyarthropathy: Secondary | ICD-10-CM

## 2024-01-30 DIAGNOSIS — D472 Monoclonal gammopathy: Secondary | ICD-10-CM

## 2024-01-30 DIAGNOSIS — I10 Essential (primary) hypertension: Secondary | ICD-10-CM | POA: Diagnosis not present

## 2024-01-30 NOTE — Progress Notes (Signed)
 [facility] [address] [place], [place] [address]  Internal MEDICINE  Office Visit Note  Patient Name: Tracey Love  [id number]  [id number]  Date of Service: 01/30/2024  Chief Complaint  Patient presents with   Follow-up    Review U/S   Hypertension   Diabetes   Medication Management    Needs to find more affordable glucose monitor    HPI Pt is here for routine follow up -US  rescheduled due to weather and is now set for Jan 9. 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 She  goes back for lab review in Jan -Podiatry in Jan as well -cardiology in a few weeks for stress test -will follow up with eye doctor for when next appt is -supposed to use accu check now--states no charger supplies and batteries not working. Will send new order and patient will take back to pharmacy. In the meantime she paid OOP for one touch strips to use with old meter since insurance no longer covers one touch supplies. -still itching, derm removed skin lesion on face but she did not address chronic itching. Hydroxyzine  not helping much.  -BP greatly improved now -had call from pharmacist documenting compliance with lipitor  Current Medication: Outpatient Encounter Medications as of 01/30/2024  Medication Sig   Accu-Chek Softclix Lancets lancets Use as instructed once a daily Dx E11.65   acetaminophen  (TYLENOL ) 500 MG tablet Take 1,000 mg by mouth every 8 (eight) hours as needed for mild pain or moderate pain.    atorvastatin  (LIPITOR) 40 MG tablet TAKE 1 TABLET BY MOUTH EVERY DAY   Cholecalciferol (VITAMIN D -3) 1000 UNITS CAPS Take 1,000 Units by mouth daily.   CRANBERRY PO Take 4,200 mg by mouth daily as needed.   dapagliflozin  propanediol (FARXIGA ) 5 MG TABS tablet Take by mouth daily.   doxycycline  (VIBRA -TABS) 100 MG  tablet Take 1 tablet (100 mg total) by mouth 2 (two) times daily.   ELIQUIS  5 MG TABS tablet TAKE 1 TABLET BY MOUTH TWICE A DAY   furosemide  (LASIX ) 20 MG tablet TAKE 1 TABLET BY MOUTH EVERY DAY FOR 3 DAYS FOR SWELLING, THEN AS NEEDED   glimepiride  (AMARYL ) 1 MG tablet TAKE 1 TABLET BY MOUTH DAILY WITH BREAKFAST.   glucose blood (ACCU-CHEK GUIDE TEST) test strip 1 each by Other route daily at 12 noon. DX E11.65   glucose blood (ONETOUCH ULTRA BLUE TEST) test strip Use as instructed   hydrALAZINE  (APRESOLINE ) 10 MG tablet Take 1 tablet (10 mg total) by mouth 2 (two) times daily.   hydrOXYzine  (ATARAX ) 10 MG tablet TAKE 1 TABLET BY MOUTH THREE TIMES A DAY AS NEEDED   Lancets (ONETOUCH DELICA PLUS LANCET33G) MISC Check blood sugar twice daily.   losartan  (COZAAR ) 25 MG tablet TAKE 1 TABLET (25 MG TOTAL) BY MOUTH DAILY.   oxybutynin  (DITROPAN -XL) 5 MG 24 hr tablet TAKE 1 TABLET BY MOUTH EVERYDAY AT BEDTIME (Patient taking differently: Take 5 mg by mouth at bedtime.)   sitaGLIPtin  (JANUVIA ) 25 MG tablet Take 1 tablet (25 mg total) by mouth daily. To be taken with 50mg  tablet for a total of 75mg  daily.   sitaGLIPtin  (JANUVIA ) 50 MG tablet Take 1 tablet (50 mg total) by mouth daily.   TURMERIC PO Take 500 mg by mouth daily.   vitamin B-12 (CYANOCOBALAMIN ) 500 MCG tablet  Take 500 mcg by mouth daily.   vitamin E 100 UNIT capsule Take 100 Units by mouth daily.   [DISCONTINUED] Blood Glucose Monitoring Suppl (ACCU-CHEK GUIDE ME) w/Device KIT Use as directed once daily DX E11.65   Blood Glucose Monitoring Suppl (ACCU-CHEK GUIDE ME) w/Device KIT Use as directed once daily DX E11.65   No facility-administered encounter medications on file as of 01/30/2024.    Surgical History: Past Surgical History:  Procedure Laterality Date   CATARACT EXTRACTION W/PHACO Right 10/21/2018   Procedure: CATARACT EXTRACTION PHACO AND INTRAOCULAR LENS PLACEMENT (IOC) RIGHT VISION BLUE DIABETIC  08.27.0  24.8%  126.0;  Surgeon:  Jaye Fallow, MD;  Location: [facility];  Service: Ophthalmology;  Laterality: Right;  diabetic - oral meds   CATARACT EXTRACTION W/PHACO Left 11/18/2018   Procedure: CATARACT EXTRACTION PHACO AND INTRAOCULAR LENS PLACEMENT (IOC) LEFT DIABETIC 01:18.5     15.8%    12.45;  Surgeon: Jaye Fallow, MD;  Location: [facility];  Service: Ophthalmology;  Laterality: Left;  DIABETIC   TUBAL LIGATION  1966    Medical History: Past Medical History:  Diagnosis Date   A-fib (HCC)    Diabetes mellitus, type 2 (HCC)    Gout    feet   Hypertension    MGUS (monoclonal gammopathy of unknown significance)    Osteoporosis    Thyroid  nodule     Family History: Family History  Problem Relation Age of Onset   Asthma Sister    Breast cancer Neg Hx     Social History   Socioeconomic History   Marital status: Divorced    Spouse name: Not on file   Number of children: Not on file   Years of education: Not on file   Highest education level: Not on file  Occupational History   Not on file  Tobacco Use   Smoking status: Never   Smokeless tobacco: Never  Vaping Use   Vaping status: Never Used  Substance and Sexual Activity   Alcohol use: Never   Drug use: Never   Sexual activity: Not on file  Other Topics Concern   Not on file  Social History Narrative   Not on file   Social Drivers of Health   Tobacco Use: Low Risk (01/30/2024)   Patient History    Smoking Tobacco Use: Never    Smokeless Tobacco Use: Never    Passive Exposure: Not on file  Financial Resource Strain: Medium Risk (03/27/2023)   Received from [facility]   Overall Financial Resource Strain (CARDIA)    Difficulty of Paying Living Expenses: Somewhat hard  Food Insecurity: Food Insecurity Present (03/27/2023)   Received from [facility]   Epic    Within the past 12 months, you worried that your food would run out before you got the money to buy more.: Never true     Within the past 12 months, the food you bought just didn't last and you didn't have money to get more.: Sometimes true  Transportation Needs: No Transportation Needs (03/27/2023)   Received from [facility] - Transportation    In the past 12 months, has lack of transportation kept you from medical appointments or from getting medications?: No    Lack of Transportation (Non-Medical): No  Physical Activity: Not on file  Stress: Not on file  Social Connections: Not on file  Intimate Partner Violence: Not on file  Depression (PHQ2-9): Low Risk (01/30/2024)  Depression (PHQ2-9)    PHQ-2 Score: 0  Alcohol Screen: Not on file  Housing: Low Risk  (03/27/2023)   [facility] from [facility]   Epic    In the last 12 months, was there a time when you were not able to pay the mortgage or rent on time?: No    In the past 12 months, how many times have you moved where you were living?: 0    At any time in the past 12 months, were you homeless or living in a shelter (including now)?: No  Utilities: Not At Risk (03/27/2023)   Received from [facility] Utilities    Threatened with loss of utilities: No  Health Literacy: Not on file      Review of Systems  Constitutional:  Negative for chills, fatigue and unexpected weight change.  HENT:  Negative for rhinorrhea, sneezing and sore throat.   Eyes:  Negative for redness.  Respiratory:  Negative for cough, chest tightness, shortness of breath and wheezing.   Cardiovascular:  Negative for chest pain and palpitations.  Gastrointestinal:  Negative for abdominal pain, constipation, diarrhea, nausea and vomiting.  Genitourinary:  Negative for dysuria and frequency.  Musculoskeletal:  Positive for arthralgias. Negative for back pain, joint swelling and neck pain.  Skin:  Negative for rash.       itching  Neurological: Negative.  Negative for dizziness, tremors, light-headedness and  numbness.  Hematological:  Negative for adenopathy. Does not bruise/bleed easily.  Psychiatric/Behavioral:  Negative for behavioral problems (Depression), sleep disturbance and suicidal ideas. The patient is not nervous/anxious.     Vital Signs: BP 110/80   Pulse 98   Temp 98 F (36.7 C)   Resp 16   Ht 5' 4 (1.626 m)   Wt 186 lb (84.4 kg)   SpO2 98%   BMI 31.93 kg/m    Physical Exam Vitals and nursing note reviewed.  HENT:     Head: Normocephalic and atraumatic.  Eyes:     Extraocular Movements: Extraocular movements intact.  Cardiovascular:     Rate and Rhythm: Normal rate. Rhythm irregular.  Pulmonary:     Effort: Pulmonary effort is normal.     Breath sounds: Normal breath sounds.  Skin:    General: Skin is warm and dry.  Neurological:     General: No focal deficit present.  Psychiatric:        Mood and Affect: Mood normal.        Behavior: Behavior normal.        Assessment/Plan: 1. Type 2 diabetes mellitus with stage 3a chronic kidney disease, without long-term current use of insulin (HCC) (Primary) New accu chek meter sent per insurance preference and since new meter not working. Insurance does not cover one touch supplies and needs to transfer to accu chek. Nephrology following renal functions. US  rescheduled to Jan now  2. Essential hypertension Well controlled in office today and pt is tolerating meds  3. Paroxysmal A-fib (HCC) Will be following up with cardiology with testing in a few weeks  4. MGUS (monoclonal gammopathy of unknown significance) Followed by hematology  5. Inflammatory polyarthropathy of multiple sites [facility]) Awaiting rheumatology  6. Itching Hydroxyzine  not helping as much now. Discussed switching to hypoallergenic detergent/soap to help remove any triggers. May need to discuss with derm as well. Will also be checking abdominal US  to evaluate liver/GB in addition to kidneys. May be related to abnormal labs.  7. Elevated  LFTs Awaiting US  for further evaluation   General Counseling: Jimmi verbalizes understanding of the findings of todays visit and agrees with plan of treatment. I have discussed any further diagnostic evaluation that may be needed or ordered today. We also reviewed her medications today. she has been encouraged to call the office with any questions or concerns that should arise related to todays visit.    No orders of the defined types were placed in this encounter.   Meds ordered this encounter  Medications   Blood Glucose Monitoring Suppl (ACCU-CHEK GUIDE ME) w/Device KIT    Sig: Use as directed once daily DX E11.65    Dispense:  1 kit    Refill:  0    This patient was seen by Tinnie Pro, PA-C in collaboration with Dr. Sigrid Bathe as a part of collaborative care agreement.   Total time spent:30 Minutes Time spent includes review of chart, medications, test results, and follow up plan with the patient.      Dr Fozia M Khan Internal medicine

## 2024-02-11 ENCOUNTER — Ambulatory Visit

## 2024-02-11 ENCOUNTER — Ambulatory Visit: Payer: Self-pay

## 2024-02-11 DIAGNOSIS — I340 Nonrheumatic mitral (valve) insufficiency: Secondary | ICD-10-CM

## 2024-02-11 DIAGNOSIS — I4821 Permanent atrial fibrillation: Secondary | ICD-10-CM

## 2024-02-11 DIAGNOSIS — I272 Pulmonary hypertension, unspecified: Secondary | ICD-10-CM | POA: Diagnosis not present

## 2024-02-11 DIAGNOSIS — I071 Rheumatic tricuspid insufficiency: Secondary | ICD-10-CM | POA: Diagnosis not present

## 2024-02-11 LAB — ECHOCARDIOGRAM COMPLETE
AR max vel: 1.61 cm2
AV Area VTI: 1.48 cm2
AV Area mean vel: 1.59 cm2
AV Mean grad: 4.5 mmHg
AV Peak grad: 8.2 mmHg
Ao pk vel: 1.44 m/s
S' Lateral: 2.33 cm

## 2024-02-24 ENCOUNTER — Inpatient Hospital Stay: Admitting: Oncology

## 2024-02-26 ENCOUNTER — Inpatient Hospital Stay: Attending: Nurse Practitioner | Admitting: Oncology

## 2024-02-26 ENCOUNTER — Encounter: Payer: Self-pay | Admitting: Oncology

## 2024-02-26 VITALS — BP 140/70 | HR 79 | Temp 98.9°F | Resp 16 | Ht 64.0 in | Wt 181.0 lb

## 2024-02-26 DIAGNOSIS — E1129 Type 2 diabetes mellitus with other diabetic kidney complication: Secondary | ICD-10-CM | POA: Insufficient documentation

## 2024-02-26 DIAGNOSIS — M109 Gout, unspecified: Secondary | ICD-10-CM | POA: Insufficient documentation

## 2024-02-26 DIAGNOSIS — Z79899 Other long term (current) drug therapy: Secondary | ICD-10-CM | POA: Diagnosis not present

## 2024-02-26 DIAGNOSIS — R17 Unspecified jaundice: Secondary | ICD-10-CM | POA: Diagnosis not present

## 2024-02-26 DIAGNOSIS — Z7984 Long term (current) use of oral hypoglycemic drugs: Secondary | ICD-10-CM | POA: Insufficient documentation

## 2024-02-26 DIAGNOSIS — N289 Disorder of kidney and ureter, unspecified: Secondary | ICD-10-CM | POA: Diagnosis not present

## 2024-02-26 DIAGNOSIS — D649 Anemia, unspecified: Secondary | ICD-10-CM | POA: Insufficient documentation

## 2024-02-26 DIAGNOSIS — I10 Essential (primary) hypertension: Secondary | ICD-10-CM | POA: Diagnosis not present

## 2024-02-26 DIAGNOSIS — Z7901 Long term (current) use of anticoagulants: Secondary | ICD-10-CM | POA: Insufficient documentation

## 2024-02-26 DIAGNOSIS — M810 Age-related osteoporosis without current pathological fracture: Secondary | ICD-10-CM | POA: Diagnosis not present

## 2024-02-26 DIAGNOSIS — I4891 Unspecified atrial fibrillation: Secondary | ICD-10-CM | POA: Insufficient documentation

## 2024-02-26 DIAGNOSIS — D472 Monoclonal gammopathy: Secondary | ICD-10-CM | POA: Insufficient documentation

## 2024-02-26 NOTE — Progress Notes (Signed)
 " [facility]  [phone]) [phone] Fax:(336) [phone]  ID: Tracey Love OB: 1940-03-03  MR#: [id number]  RDW#:[id number]  Patient Care Team: Kristina Tinnie MARLA DEVONNA as PCP - General (Physician Assistant) [place] Clap, MD as PCP - Cardiology (Cardiology) Jacobo Evalene JINNY, MD as Consulting Physician (Oncology)  CHIEF COMPLAINT: MGUS.  INTERVAL HISTORY: Patient last seen in clinic in 2022 and was recently referred back for further evaluation of her MGUS.  She she was evaluated by APP approximately 1 month ago and returns today to discuss her laboratory results.  She currently feels well and is asymptomatic.  She does not complain of any pain.  She has no neurologic complaints.  She denies any recent fevers or illnesses.  She has a good appetite and denies weight loss.  She has no chest pain, shortness of breath, cough, or hemoptysis.  She denies any nausea, vomiting, constipation, or diarrhea.  She has no urinary complaints.  Patient offers no specific complaints today.  REVIEW OF SYSTEMS:   Review of Systems  Constitutional: Negative.  Negative for fever, malaise/fatigue and weight loss.  Respiratory: Negative.  Negative for cough, hemoptysis and shortness of breath.   Cardiovascular: Negative.  Negative for chest pain and leg swelling.  Gastrointestinal: Negative.  Negative for abdominal pain.  Genitourinary: Negative.  Negative for dysuria.  Musculoskeletal: Negative.  Negative for back pain.  Skin: Negative.  Negative for rash.  Neurological: Negative.  Negative for dizziness, focal weakness, weakness and headaches.  Psychiatric/Behavioral: Negative.  The patient is not nervous/anxious.     As per HPI. Otherwise, a complete review of systems is negative.  PAST MEDICAL HISTORY: Past Medical History:  Diagnosis Date   A-fib (HCC)    Diabetes mellitus, type 2 (HCC)    Gout    feet   Hypertension    MGUS (monoclonal gammopathy of unknown significance)     Osteoporosis    Thyroid  nodule     PAST SURGICAL HISTORY: Past Surgical History:  Procedure Laterality Date   CATARACT EXTRACTION W/PHACO Right 10/21/2018   Procedure: CATARACT EXTRACTION PHACO AND INTRAOCULAR LENS PLACEMENT (IOC) RIGHT VISION BLUE DIABETIC  08.27.0  24.8%  126.0;  Surgeon: Jaye Fallow, MD;  Location: [facility];  Service: Ophthalmology;  Laterality: Right;  diabetic - oral meds   CATARACT EXTRACTION W/PHACO Left 11/18/2018   Procedure: CATARACT EXTRACTION PHACO AND INTRAOCULAR LENS PLACEMENT (IOC) LEFT DIABETIC 01:18.5     15.8%    12.45;  Surgeon: Jaye Fallow, MD;  Location: [facility];  Service: Ophthalmology;  Laterality: Left;  DIABETIC   TUBAL LIGATION  1966    FAMILY HISTORY: Family History  Problem Relation Age of Onset   Asthma Sister    Breast cancer Neg Hx     ADVANCED DIRECTIVES (Y/N):  N  HEALTH MAINTENANCE: Social History[1]   Colonoscopy:  PAP:  Bone density:  Lipid panel:  Allergies[2]  Current Outpatient Medications  Medication Sig Dispense Refill   Accu-Chek Softclix Lancets lancets Use as instructed once a daily Dx E11.65 100 each 1   acetaminophen  (TYLENOL ) 500 MG tablet Take 1,000 mg by mouth every 8 (eight) hours as needed for mild pain or moderate pain.      atorvastatin  (LIPITOR) 40 MG tablet TAKE 1 TABLET BY MOUTH EVERY DAY 90 tablet 1   Blood Glucose Monitoring Suppl (ACCU-CHEK GUIDE ME) w/Device KIT Use as directed once daily DX E11.65 1 kit 0   Cholecalciferol (VITAMIN D -3) 1000 UNITS CAPS Take  1,000 Units by mouth daily.     CRANBERRY PO Take 4,200 mg by mouth daily as needed.     dapagliflozin  propanediol (FARXIGA ) 5 MG TABS tablet Take by mouth daily.     doxycycline  (VIBRA -TABS) 100 MG tablet Take 1 tablet (100 mg total) by mouth 2 (two) times daily. 14 tablet 0   ELIQUIS  5 MG TABS tablet TAKE 1 TABLET BY MOUTH TWICE A DAY 60 tablet 3   furosemide  (LASIX ) 20 MG tablet TAKE 1 TABLET BY MOUTH  EVERY DAY FOR 3 DAYS FOR SWELLING, THEN AS NEEDED 90 tablet 1   glimepiride  (AMARYL ) 1 MG tablet TAKE 1 TABLET BY MOUTH DAILY WITH BREAKFAST. 90 tablet 1   glucose blood (ACCU-CHEK GUIDE TEST) test strip 1 each by Other route daily at 12 noon. DX E11.65 100 each 1   glucose blood (ONETOUCH ULTRA BLUE TEST) test strip Use as instructed 100 each 12   hydrALAZINE  (APRESOLINE ) 10 MG tablet Take 1 tablet (10 mg total) by mouth 2 (two) times daily. 60 tablet 2   hydrOXYzine  (ATARAX ) 10 MG tablet TAKE 1 TABLET BY MOUTH THREE TIMES A DAY AS NEEDED 270 tablet 1   Lancets (ONETOUCH DELICA PLUS LANCET33G) MISC Check blood sugar twice daily. 100 each 12   losartan  (COZAAR ) 25 MG tablet TAKE 1 TABLET (25 MG TOTAL) BY MOUTH DAILY. 90 tablet 1   oxybutynin  (DITROPAN -XL) 5 MG 24 hr tablet TAKE 1 TABLET BY MOUTH EVERYDAY AT BEDTIME (Patient taking differently: Take 5 mg by mouth at bedtime.) 90 tablet 1   sitaGLIPtin  (JANUVIA ) 25 MG tablet Take 1 tablet (25 mg total) by mouth daily. To be taken with 50mg  tablet for a total of 75mg  daily. 90 tablet 1   sitaGLIPtin  (JANUVIA ) 50 MG tablet Take 1 tablet (50 mg total) by mouth daily. 90 tablet 1   TURMERIC PO Take 500 mg by mouth daily.     vitamin B-12 (CYANOCOBALAMIN ) 500 MCG tablet Take 500 mcg by mouth daily.     vitamin E 100 UNIT capsule Take 100 Units by mouth daily.     No current facility-administered medications for this visit.    OBJECTIVE: Vitals:   02/26/24 0948  BP: (!) 140/70  Pulse: 79  Resp: 16  Temp: 98.9 F (37.2 C)  SpO2: 100%     Body mass index is 31.07 kg/m.    ECOG FS:0 - Asymptomatic  General: Well-developed, well-nourished, no acute distress.  Sitting in wheelchair. Eyes: Pink conjunctiva, anicteric sclera. HEENT: Normocephalic, moist mucous membranes. Lungs: No audible wheezing or coughing. Heart: Regular rate and rhythm. Abdomen: Soft, nontender, no obvious distention. Musculoskeletal: No edema, cyanosis, or  clubbing. Neuro: Alert, answering all questions appropriately. Cranial nerves grossly intact. Skin: No rashes or petechiae noted. Psych: Normal affect. Lymphatics: No cervical, calvicular, axillary or inguinal LAD.   LAB RESULTS:  Lab Results  Component Value Date   NA 140 01/23/2024   K 4.2 01/23/2024   CL 102 01/23/2024   CO2 25 01/23/2024   GLUCOSE 147 (H) 01/23/2024   BUN 12 01/23/2024   CREATININE 1.17 (H) 01/23/2024   CALCIUM  10.4 (H) 01/23/2024   PROT 8.7 (H) 01/23/2024   ALBUMIN 4.3 01/23/2024   AST 65 (H) 01/23/2024   ALT 40 01/23/2024   ALKPHOS 456 (H) 01/23/2024   BILITOT 1.5 (H) 01/23/2024   GFRNONAA 46 (L) 01/23/2024   GFRAA >60 11/18/2018    Lab Results  Component Value Date   WBC 5.0 01/23/2024  NEUTROABS 2.8 01/23/2024   HGB 11.5 (L) 01/23/2024   HCT 34.3 (L) 01/23/2024   MCV 106.2 (H) 01/23/2024   PLT 186 01/23/2024     STUDIES: ECHOCARDIOGRAM COMPLETE Result Date: 02/11/2024    ECHOCARDIOGRAM REPORT   Patient Name:   Tracey Love Date of Exam: 02/11/2024 Medical Rec #:  [id number]     Height:       64.0 in Accession #:    [id number]    Weight:       186.0 lb Date of Birth:  [birth year]     BSA:          1.897 m Patient Age:    83 years      BP:           110/80 mmHg Patient Gender: F             HR:           88 bpm. Exam Location:  [place] Procedure: 2D Echo, Cardiac Doppler and Color Doppler (Both Spectral and Color            Flow Doppler were utilized during procedure). Indications:    I48.91* Unspeicified atrial fibrillation  History:        Patient has no prior history of Echocardiogram examinations.                 Abnormal ECG, Pulmonary HTN, Arrythmias:Atrial Fibrillation,                 Signs/Symptoms:Shortness of Breath and Dyspnea; Risk                 Factors:Hypertension, Diabetes and Dyslipidemia.  Sonographer:    Doyal Point MHA, BS, RDCS Referring Phys: [id number] [facility] IMPRESSIONS  1. Left ventricular ejection fraction, by  estimation, is 55 to 60%. The left ventricle has normal function. Left ventricular endocardial border not optimally defined to evaluate regional wall motion. Left ventricular diastolic parameters are indeterminate.  2. Right ventricular systolic function is normal. The right ventricular size is moderately enlarged. There is moderately elevated pulmonary artery systolic pressure. The estimated right ventricular systolic pressure is 47.2 mmHg.  3. Right atrial size was severely dilated.  4. The mitral valve is degenerative. Mild to moderate mitral valve regurgitation. There is mild prolapse of of the mitral valve. Moderate mitral annular calcification.  5. Tricuspid valve regurgitation is moderate to severe.  6. The aortic valve is tricuspid. Aortic valve regurgitation is trivial. Aortic valve sclerosis/calcification is present, without any evidence of aortic stenosis.  7. 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  8. The inferior vena cava is normal in size with <50% respiratory variability, suggesting right atrial pressure of 8 mmHg. Comparison(s): No prior Echocardiogram. FINDINGS  Left Ventricle: Left ventricular ejection fraction, by estimation, is 55 to 60%. The left ventricle has normal function. Left ventricular endocardial border not optimally defined to evaluate regional wall motion. The left ventricular internal cavity size was small. There is no left ventricular hypertrophy. Left ventricular diastolic parameters are indeterminate. Right Ventricle: The right ventricular size is moderately enlarged. Right vetricular wall thickness was not well visualized. Right ventricular systolic function is normal. There is moderately elevated pulmonary artery systolic pressure. The tricuspid regurgitant velocity is 3.13 m/s, and with an assumed right atrial pressure of 8 mmHg, the estimated right ventricular systolic pressure is 47.2 mmHg. Left Atrium: Left atrial size was normal in size. Right Atrium: Right  atrial size was severely dilated. Pericardium: There  is no evidence of pericardial effusion. Mitral Valve: The mitral valve is degenerative in appearance. There is mild prolapse of of the mitral valve. There is moderate calcification of the mitral valve leaflet(s). Moderate mitral annular calcification. Mild to moderate mitral valve regurgitation. Tricuspid Valve: The tricuspid valve is normal in structure. Tricuspid valve regurgitation is moderate to severe. No evidence of tricuspid stenosis. Aortic Valve: The aortic valve is tricuspid. Aortic valve regurgitation is trivial. Aortic valve sclerosis/calcification is present, without any evidence of aortic stenosis. Aortic valve mean gradient measures 4.5 mmHg. Aortic valve peak gradient measures 8.2 mmHg. Aortic valve area, by VTI measures 1.48 cm. Pulmonic Valve: The pulmonic valve was not well visualized. 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 No evidence of pulmonic stenosis. Aorta: The aortic root and ascending aorta are structurally normal, with no evidence of dilitation. Venous: The inferior vena cava is normal in size with less than 50% respiratory variability, suggesting right atrial pressure of 8 mmHg. IAS/Shunts: The interatrial septum was not well visualized.  LEFT VENTRICLE PLAX 2D LVIDd:         3.16 cm LVIDs:         2.33 cm LV PW:         1.02 cm LV IVS:        1.05 cm LVOT diam:     1.70 cm LV SV:         44 LV SV Index:   23 LVOT Area:     2.27 cm  RIGHT VENTRICLE             IVC RV Basal diam:  4.48 cm     IVC diam: 2.08 cm RV Mid diam:    3.47 cm RV S prime:     12.70 cm/s TAPSE (M-mode): 2.4 cm LEFT ATRIUM             Index        RIGHT ATRIUM           Index LA diam:        4.20 cm 2.21 cm/m   RA Area:     25.90 cm LA Vol (A2C):   59.8 ml 31.52 ml/m  RA Volume:   84.50 ml  44.55 ml/m LA Vol (A4C):   54.0 ml 28.47 ml/m LA Biplane Vol: 58.0 ml 30.58 ml/m  AORTIC VALVE AV Area (Vmax):    1.61 cm AV Area (Vmean):   1.59 cm AV  Area (VTI):     1.48 cm AV Vmax:           143.50 cm/s AV Vmean:          99.250 cm/s AV VTI:            0.299 m AV Peak Grad:      8.2 mmHg AV Mean Grad:      4.5 mmHg LVOT Vmax:         102.00 cm/s LVOT Vmean:        69.700 cm/s LVOT VTI:          0.195 m LVOT/AV VTI ratio: 0.65  AORTA Ao Sinus diam: 2.41 cm Ao Asc diam:   3.30 cm TRICUSPID VALVE TR Peak grad:   39.2 mmHg TR Vmax:        313.00 cm/s  SHUNTS Systemic VTI:  0.20 m Systemic Diam: 1.70 cm Caron Poser Electronically signed by Caron Poser Signature Date/Time: 02/11/2024/12:29:31 PM    Final     ASSESSMENT: MGUS  PLAN:  MGUS: Previously, patient's M spike was stable between 0.4 and 0.6.  Her most recent result on January 23, 2024 revealed an M spike of 0.9.  Immunoglobulins have also trended up since 2022 and are now 2202.  Both kappa and lambda free light chains are elevated, but she has a normal light chain ratio.  She has a mild anemia of 11.5.  Patient also noted to have hypercalcemia and renal insufficiency, but these are both chronic and unchanged.  After lengthy discussion with the patient on whether or not to pursue bone marrow biopsy she ultimately declined this procedure.  She did agree to metastatic bone survey.  Return to clinic in 3 months with repeat laboratory work and further evaluation.  Patient states that she will likely agree to biopsy if her M spike or immunoglobulins continue to trend up. Renal insufficiency: Chronic and unchanged.  Patient's most recent creatinine is 1.17. Hypocalcemia: Chronic and unchanged.  Patient's most recent calcium  was 10.4. Hyperbilirubinemia: Mild, monitor.  Patient's total bilirubin is 1.5. Anemia: Mild, monitor.  Patient's hemoglobin is 11.5.  Patient expressed understanding and was in agreement with this plan. She also understands that She can call clinic at any time with any questions, concerns, or complaints.    Cancer Staging  No matching staging information was found for  the patient.   Evalene JINNY Reusing, MD   02/26/2024 12:38 PM        [1]  Social History Tobacco Use   Smoking status: Never   Smokeless tobacco: Never  Vaping Use   Vaping status: Never Used  Substance Use Topics   Alcohol use: Never   Drug use: Never  [2]  Allergies Allergen Reactions   Gabapentin Nausea And Vomiting and Nausea Only    Upset stomach  Upset stomach  Upset stomach  Upset stomach    Upset stomach Upset stomach   Penicillins     Doesn't remember reaction   Rivaroxaban Other (See Comments)    Patient stated she had a lot of blood gushing in her mouth when she brushed her teeth   "

## 2024-02-26 NOTE — Progress Notes (Signed)
 Patient would like to discuss her lab results from 01/23/2024.

## 2024-02-28 ENCOUNTER — Ambulatory Visit

## 2024-03-04 ENCOUNTER — Ambulatory Visit
Admission: RE | Admit: 2024-03-04 | Discharge: 2024-03-04 | Disposition: A | Discharge status: Home / Self Care | Attending: Oncology | Admitting: Oncology

## 2024-03-04 ENCOUNTER — Ambulatory Visit
Admission: RE | Admit: 2024-03-04 | Discharge: 2024-03-04 | Disposition: A | Discharge status: Home / Self Care | Source: Ambulatory Visit | Attending: Oncology | Admitting: Oncology

## 2024-03-04 DIAGNOSIS — D472 Monoclonal gammopathy: Secondary | ICD-10-CM

## 2024-03-11 ENCOUNTER — Telehealth: Payer: Self-pay | Admitting: Physician Assistant

## 2024-03-11 NOTE — Telephone Encounter (Signed)
 Per [place] w/ [facility] Rheumatology referral has been closed due to patient not returning calls to schedule-Toni

## 2024-03-13 ENCOUNTER — Ambulatory Visit: Admitting: Podiatry

## 2024-03-19 ENCOUNTER — Ambulatory Visit

## 2024-04-02 ENCOUNTER — Ambulatory Visit: Admitting: Physician Assistant

## 2024-04-03 ENCOUNTER — Ambulatory Visit

## 2024-04-30 ENCOUNTER — Ambulatory Visit: Admitting: Physician Assistant

## 2024-05-01 ENCOUNTER — Ambulatory Visit

## 2024-05-07 ENCOUNTER — Ambulatory Visit: Admitting: Physician Assistant

## 2024-05-15 ENCOUNTER — Ambulatory Visit: Admitting: Podiatry

## 2024-06-02 ENCOUNTER — Inpatient Hospital Stay

## 2024-06-09 ENCOUNTER — Inpatient Hospital Stay: Admitting: Oncology

## 2024-07-20 ENCOUNTER — Ambulatory Visit: Admitting: Physician Assistant
